# Patient Record
Sex: Male | Born: 1950
Health system: Southern US, Community
[De-identification: ages and names within clinical notes are randomized; demographics above are authoritative.]

## PROBLEM LIST (undated history)

## (undated) DIAGNOSIS — IMO0002 Reserved for concepts with insufficient information to code with codable children: Secondary | ICD-10-CM

## (undated) DIAGNOSIS — K4090 Unilateral inguinal hernia, without obstruction or gangrene, not specified as recurrent: Secondary | ICD-10-CM

## (undated) DIAGNOSIS — K219 Gastro-esophageal reflux disease without esophagitis: Secondary | ICD-10-CM

## (undated) DIAGNOSIS — K602 Anal fissure, unspecified: Secondary | ICD-10-CM

## (undated) DIAGNOSIS — T7840XA Allergy, unspecified, initial encounter: Secondary | ICD-10-CM

## (undated) DIAGNOSIS — K449 Diaphragmatic hernia without obstruction or gangrene: Secondary | ICD-10-CM

## (undated) DIAGNOSIS — M5030 Other cervical disc degeneration, unspecified cervical region: Secondary | ICD-10-CM

## (undated) DIAGNOSIS — C801 Malignant (primary) neoplasm, unspecified: Secondary | ICD-10-CM

## (undated) DIAGNOSIS — M1990 Unspecified osteoarthritis, unspecified site: Secondary | ICD-10-CM

## (undated) DIAGNOSIS — K589 Irritable bowel syndrome without diarrhea: Secondary | ICD-10-CM

## (undated) DIAGNOSIS — I10 Essential (primary) hypertension: Secondary | ICD-10-CM

## (undated) HISTORY — DX: Irritable bowel syndrome, unspecified: K58.9

## (undated) HISTORY — DX: Essential (primary) hypertension: I10

## (undated) HISTORY — DX: Diaphragmatic hernia without obstruction or gangrene: K44.9

## (undated) HISTORY — DX: Unilateral inguinal hernia, without obstruction or gangrene, not specified as recurrent: K40.90

## (undated) HISTORY — DX: Other cervical disc degeneration, unspecified cervical region: M50.30

## (undated) HISTORY — PX: INGUINAL HERNIA REPAIR: SUR1180

## (undated) HISTORY — PX: MOHS SURGERY: SUR867

## (undated) HISTORY — DX: Malignant (primary) neoplasm, unspecified: C80.1

## (undated) HISTORY — PX: TONSILLECTOMY AND ADENOIDECTOMY: SHX28

## (undated) HISTORY — PX: NASAL SEPTUM SURGERY: SHX37

## (undated) HISTORY — DX: Gastro-esophageal reflux disease without esophagitis: K21.9

## (undated) HISTORY — DX: Allergy, unspecified, initial encounter: T78.40XA

## (undated) HISTORY — DX: Anal fissure, unspecified: K60.2

## (undated) HISTORY — DX: Unspecified osteoarthritis, unspecified site: M19.90

## (undated) HISTORY — PX: TONSILLECTOMY AND ADENOIDECTOMY: SUR1326

## (undated) HISTORY — PX: FINGER SURGERY: SHX640

## (undated) HISTORY — DX: Reserved for concepts with insufficient information to code with codable children: IMO0002

## (undated) HISTORY — PX: OTHER SURGICAL HISTORY: SHX169

---

## 1998-08-06 ENCOUNTER — Encounter: Payer: Self-pay | Admitting: Internal Medicine

## 1998-08-06 ENCOUNTER — Ambulatory Visit (HOSPITAL_COMMUNITY): Admission: RE | Admit: 1998-08-06 | Discharge: 1998-08-06 | Payer: Self-pay | Admitting: Internal Medicine

## 1999-08-05 ENCOUNTER — Ambulatory Visit (HOSPITAL_COMMUNITY): Admission: RE | Admit: 1999-08-05 | Discharge: 1999-08-05 | Payer: Self-pay | Admitting: *Deleted

## 1999-08-05 ENCOUNTER — Encounter: Payer: Self-pay | Admitting: *Deleted

## 1999-09-23 ENCOUNTER — Ambulatory Visit (HOSPITAL_COMMUNITY): Admission: RE | Admit: 1999-09-23 | Discharge: 1999-09-23 | Payer: Self-pay | Admitting: *Deleted

## 1999-10-26 ENCOUNTER — Ambulatory Visit (HOSPITAL_BASED_OUTPATIENT_CLINIC_OR_DEPARTMENT_OTHER): Admission: RE | Admit: 1999-10-26 | Discharge: 1999-10-26 | Payer: Self-pay | Admitting: *Deleted

## 2000-12-21 ENCOUNTER — Ambulatory Visit (HOSPITAL_COMMUNITY): Admission: RE | Admit: 2000-12-21 | Discharge: 2000-12-21 | Payer: Self-pay | Admitting: *Deleted

## 2000-12-21 ENCOUNTER — Encounter (INDEPENDENT_AMBULATORY_CARE_PROVIDER_SITE_OTHER): Payer: Self-pay | Admitting: Specialist

## 2000-12-21 ENCOUNTER — Encounter (INDEPENDENT_AMBULATORY_CARE_PROVIDER_SITE_OTHER): Payer: Self-pay | Admitting: *Deleted

## 2002-08-08 ENCOUNTER — Encounter: Admission: RE | Admit: 2002-08-08 | Discharge: 2002-08-08 | Payer: Self-pay | Admitting: *Deleted

## 2002-08-08 ENCOUNTER — Encounter: Payer: Self-pay | Admitting: *Deleted

## 2002-09-05 ENCOUNTER — Encounter (INDEPENDENT_AMBULATORY_CARE_PROVIDER_SITE_OTHER): Payer: Self-pay | Admitting: Specialist

## 2002-09-05 ENCOUNTER — Ambulatory Visit (HOSPITAL_COMMUNITY): Admission: RE | Admit: 2002-09-05 | Discharge: 2002-09-05 | Payer: Self-pay | Admitting: *Deleted

## 2002-09-05 ENCOUNTER — Encounter (INDEPENDENT_AMBULATORY_CARE_PROVIDER_SITE_OTHER): Payer: Self-pay | Admitting: *Deleted

## 2003-11-15 ENCOUNTER — Encounter (INDEPENDENT_AMBULATORY_CARE_PROVIDER_SITE_OTHER): Payer: Self-pay | Admitting: *Deleted

## 2003-11-15 ENCOUNTER — Encounter (INDEPENDENT_AMBULATORY_CARE_PROVIDER_SITE_OTHER): Payer: Self-pay | Admitting: Specialist

## 2003-11-15 ENCOUNTER — Ambulatory Visit (HOSPITAL_COMMUNITY): Admission: RE | Admit: 2003-11-15 | Discharge: 2003-11-15 | Payer: Self-pay | Admitting: *Deleted

## 2006-01-17 ENCOUNTER — Encounter: Payer: Self-pay | Admitting: Physician Assistant

## 2006-01-17 ENCOUNTER — Ambulatory Visit (HOSPITAL_COMMUNITY): Admission: RE | Admit: 2006-01-17 | Discharge: 2006-01-17 | Payer: Self-pay | Admitting: *Deleted

## 2006-01-17 ENCOUNTER — Encounter (INDEPENDENT_AMBULATORY_CARE_PROVIDER_SITE_OTHER): Payer: Self-pay | Admitting: *Deleted

## 2006-02-24 ENCOUNTER — Encounter: Admission: RE | Admit: 2006-02-24 | Discharge: 2006-02-24 | Payer: Self-pay | Admitting: General Surgery

## 2009-03-27 ENCOUNTER — Encounter: Payer: Self-pay | Admitting: Physician Assistant

## 2009-04-24 ENCOUNTER — Encounter (INDEPENDENT_AMBULATORY_CARE_PROVIDER_SITE_OTHER): Payer: Self-pay | Admitting: *Deleted

## 2009-04-24 ENCOUNTER — Ambulatory Visit (HOSPITAL_COMMUNITY): Admission: RE | Admit: 2009-04-24 | Discharge: 2009-04-24 | Payer: Self-pay | Admitting: *Deleted

## 2009-04-24 ENCOUNTER — Encounter: Payer: Self-pay | Admitting: Physician Assistant

## 2009-07-09 ENCOUNTER — Encounter: Payer: Self-pay | Admitting: Physician Assistant

## 2009-07-16 ENCOUNTER — Encounter (INDEPENDENT_AMBULATORY_CARE_PROVIDER_SITE_OTHER): Payer: Self-pay | Admitting: *Deleted

## 2009-07-16 ENCOUNTER — Encounter: Admission: RE | Admit: 2009-07-16 | Discharge: 2009-07-16 | Payer: Self-pay | Admitting: Endocrinology

## 2009-07-16 ENCOUNTER — Telehealth: Payer: Self-pay | Admitting: Gastroenterology

## 2009-07-18 ENCOUNTER — Ambulatory Visit: Payer: Self-pay | Admitting: Gastroenterology

## 2009-07-18 DIAGNOSIS — R197 Diarrhea, unspecified: Secondary | ICD-10-CM | POA: Insufficient documentation

## 2009-07-18 DIAGNOSIS — R143 Flatulence: Secondary | ICD-10-CM

## 2009-07-18 DIAGNOSIS — R141 Gas pain: Secondary | ICD-10-CM | POA: Insufficient documentation

## 2009-07-18 DIAGNOSIS — R142 Eructation: Secondary | ICD-10-CM

## 2009-07-18 DIAGNOSIS — K589 Irritable bowel syndrome without diarrhea: Secondary | ICD-10-CM | POA: Insufficient documentation

## 2009-07-18 DIAGNOSIS — I10 Essential (primary) hypertension: Secondary | ICD-10-CM | POA: Insufficient documentation

## 2009-07-18 DIAGNOSIS — R1084 Generalized abdominal pain: Secondary | ICD-10-CM | POA: Insufficient documentation

## 2009-07-18 DIAGNOSIS — K219 Gastro-esophageal reflux disease without esophagitis: Secondary | ICD-10-CM | POA: Insufficient documentation

## 2009-07-18 DIAGNOSIS — R109 Unspecified abdominal pain: Secondary | ICD-10-CM | POA: Insufficient documentation

## 2009-07-18 LAB — CONVERTED CEMR LAB
CRP, High Sensitivity: 3.5 (ref 0.00–5.00)
Sed Rate: 19 mm/hr (ref 0–22)

## 2009-07-21 ENCOUNTER — Encounter: Payer: Self-pay | Admitting: Physician Assistant

## 2009-08-05 ENCOUNTER — Ambulatory Visit: Payer: Self-pay | Admitting: Gastroenterology

## 2009-08-05 DIAGNOSIS — K4090 Unilateral inguinal hernia, without obstruction or gangrene, not specified as recurrent: Secondary | ICD-10-CM | POA: Insufficient documentation

## 2010-01-25 ENCOUNTER — Emergency Department (HOSPITAL_COMMUNITY): Admission: EM | Admit: 2010-01-25 | Discharge: 2010-01-25 | Payer: Self-pay | Admitting: Emergency Medicine

## 2010-08-29 IMAGING — CT CT PELVIS W/ CM
2 of 5 series · 17 of 46 positions shown, 19 images · IV contrast (30CC OMNI 350 & [ID] OMNI 300)
Comparison: 02/24/2006 pelvic CT.

CT ABDOMEN

CLINICAL DATA: Abdominal and pelvic pain.  Bowel changes.

CT ABDOMEN AND PELVIS WITH CONTRAST
TECHNIQUE: Multidetector CT imaging of the abdomen and pelvis was
performed using the standard protocol following bolus
administration of intravenous contrast.
Contrast: 100 ml intravenous Amnipaque-JHH

[Series 2: abdomen w/ · axial · 0.84mm/px · z∈[-326,+34]mm · 14 of 82 slices shown, 16 images]
[im 5/82  soft-tissue]
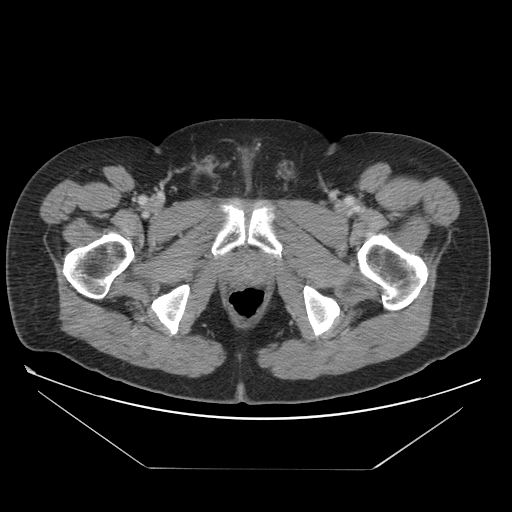
[im 5/82  bone]
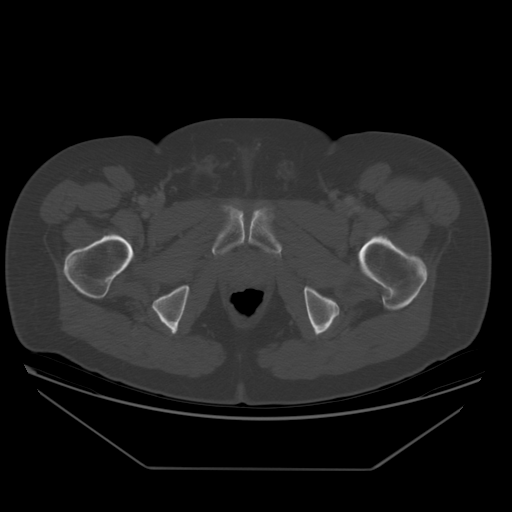
[im 9/82  soft-tissue]
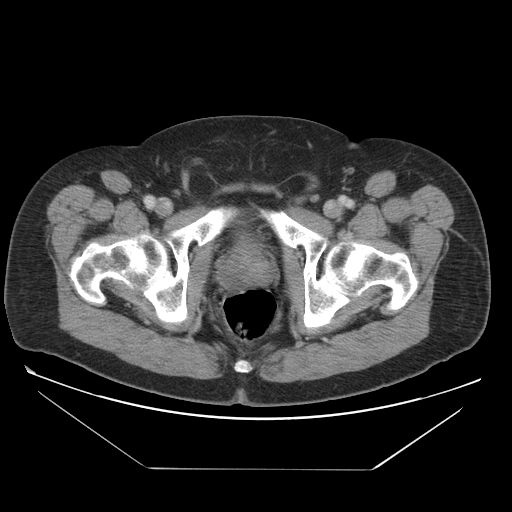
[im 18/82  soft-tissue]
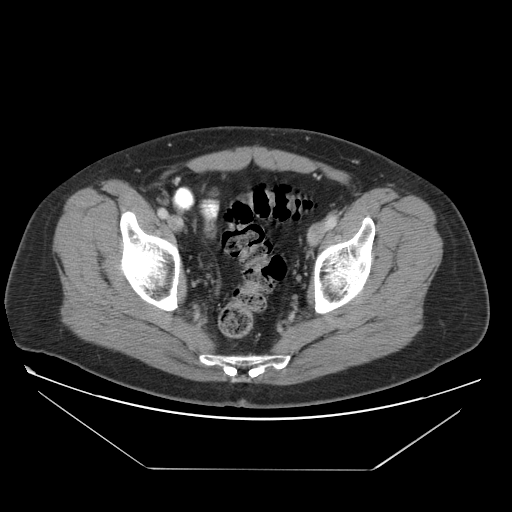
[im 22/82  soft-tissue]
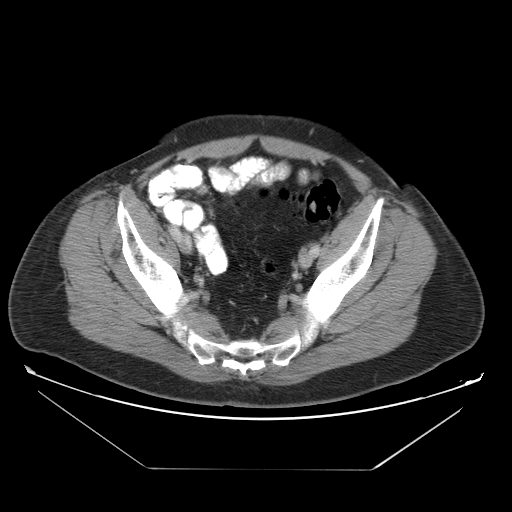
[im 26/82  soft-tissue]
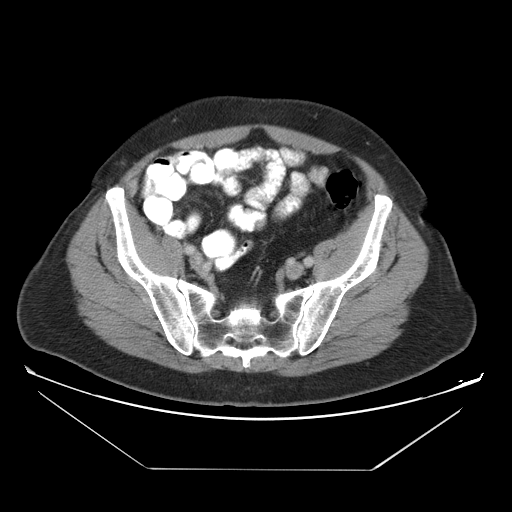
[im 35/82  soft-tissue]
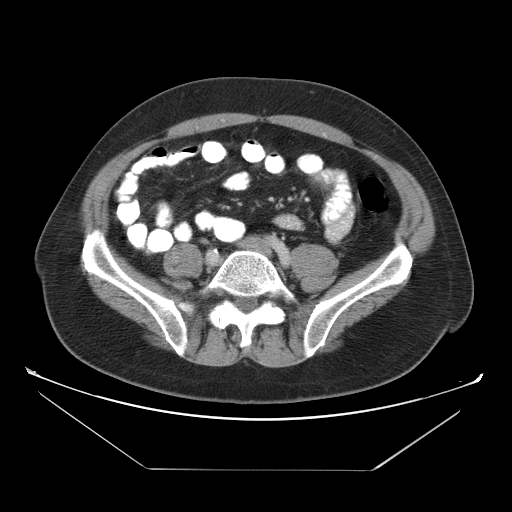
[im 39/82  soft-tissue]
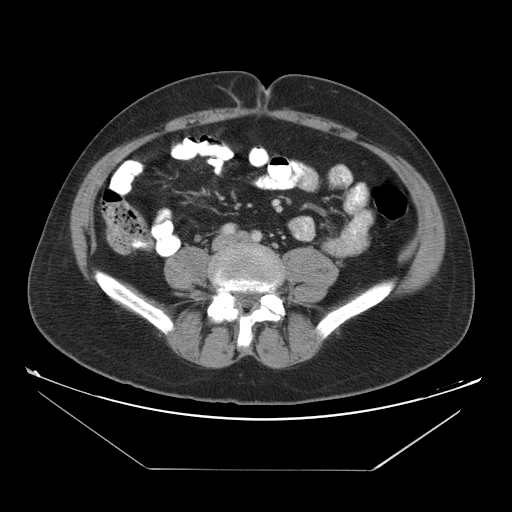
[im 43/82  soft-tissue]
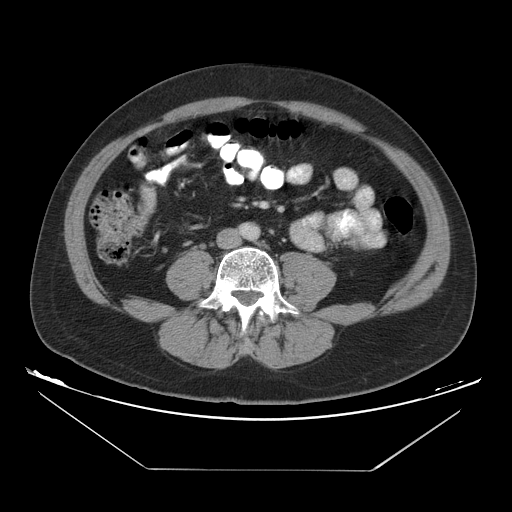
[im 47/82  soft-tissue]
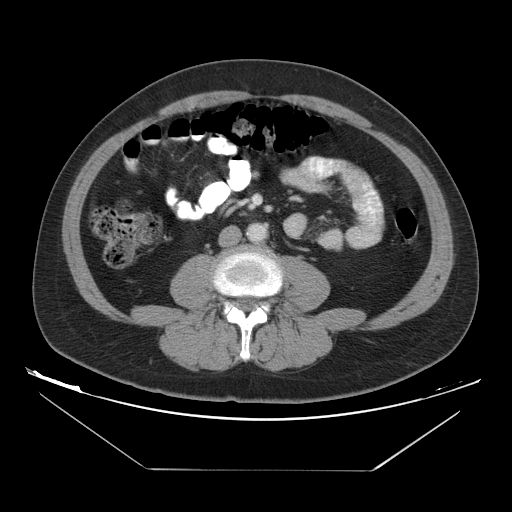
[im 47/82  bone]
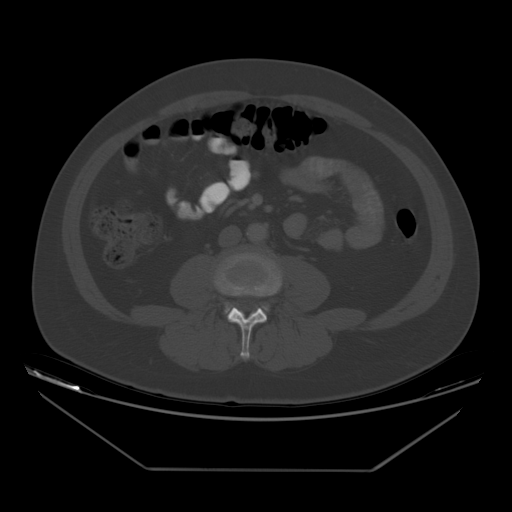
[im 56/82  soft-tissue]
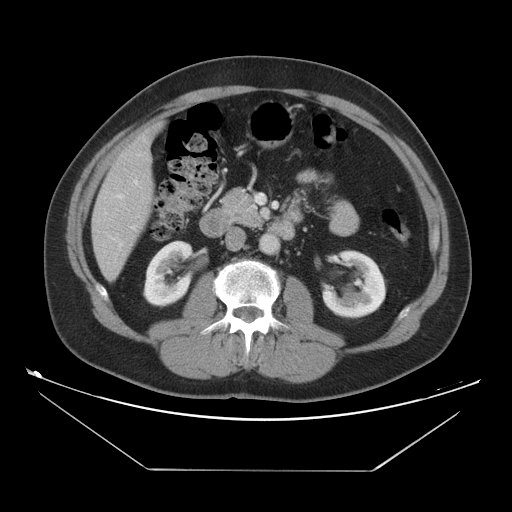
[im 60/82  soft-tissue]
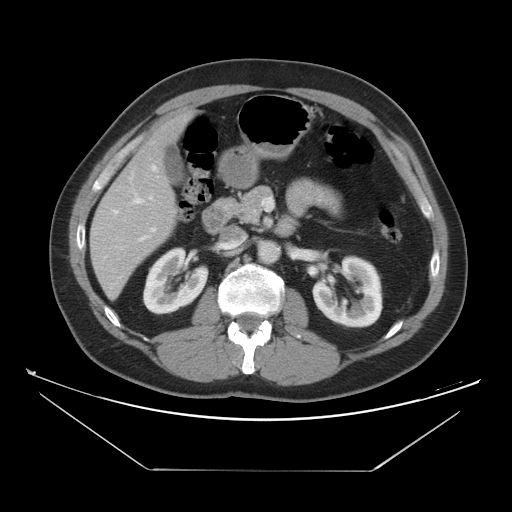
[im 64/82  soft-tissue]
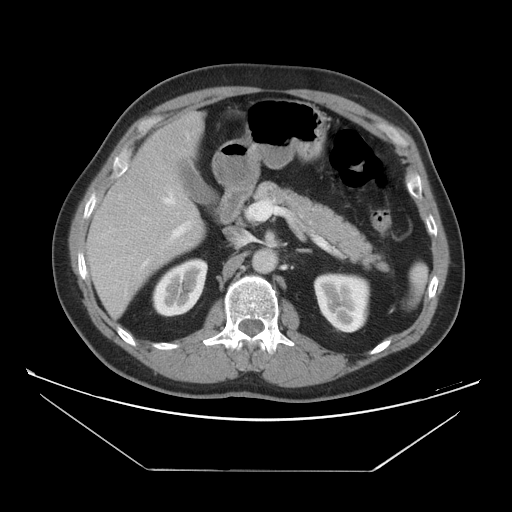
[im 73/82  soft-tissue]
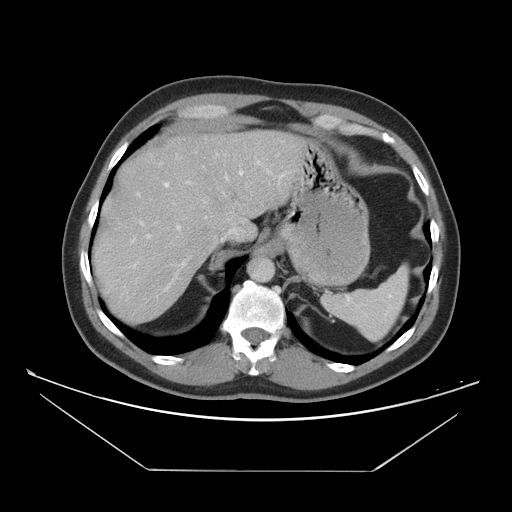
[im 77/82  soft-tissue]
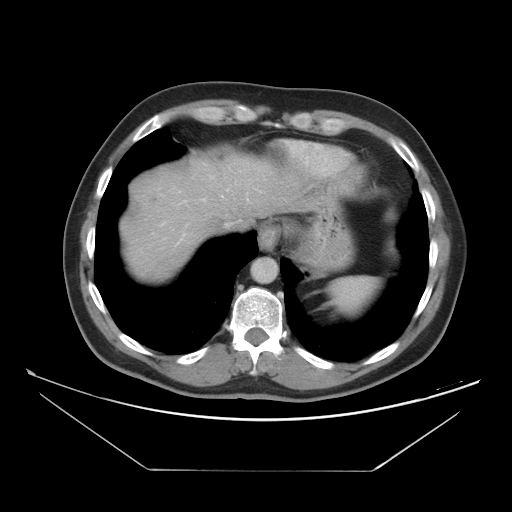

[Series 401: cor · coronal · 0.84mm/px · 3 of 122 slices shown]
[im 41/122  soft-tissue]
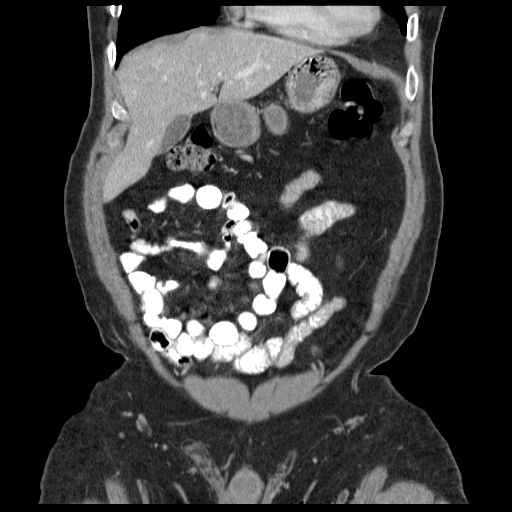
[im 54/122  soft-tissue]
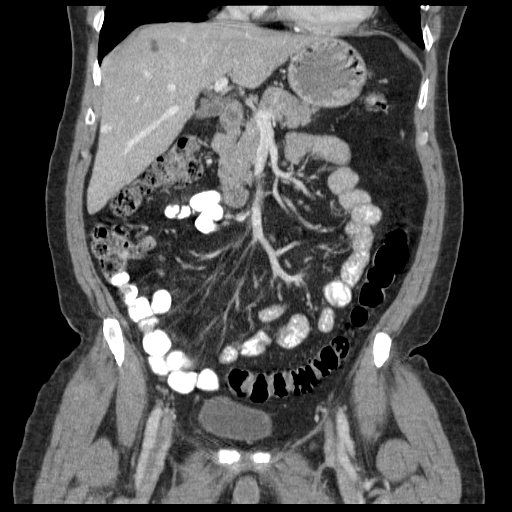
[im 68/122  soft-tissue]
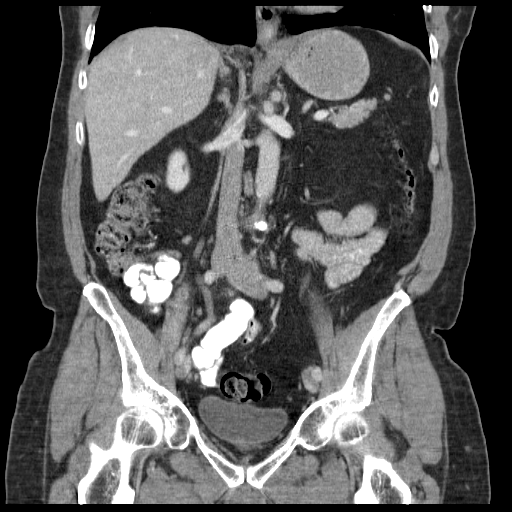

[17 of 46 positions shown; findings below may reference images not displayed]

FINDINGS: Small hepatic cyst is identified.  The liver is
otherwise unremarkable.
The gallbladder, spleen, adrenal glands, kidneys, and pancreas are
unremarkable.
No free fluid, enlarged lymph nodes, biliary dilation or abdominal
aortic aneurysm identified.
The visualized bowel is unremarkable.
No acute or suspicious bony abnormalities are identified.
IMPRESSION: No significant abnormalities identified.

CT PELVIS
FINDINGS: The bowel and bladder are unremarkable.
Small bilateral inguinal hernias containing fat are stable.
No evidence of free fluid, enlarged lymph nodes or urinary calculi
within the pelvis.
Mild prostate enlargement is again identified.
No acute or suspicious bony abnormalities are identified.
IMPRESSION: No evidence of acute abnormality.

Stable small bilateral inguinal hernias containing fat.

## 2010-11-01 ENCOUNTER — Encounter (HOSPITAL_BASED_OUTPATIENT_CLINIC_OR_DEPARTMENT_OTHER): Payer: Self-pay | Admitting: General Surgery

## 2010-12-31 ENCOUNTER — Encounter: Payer: Self-pay | Admitting: Gastroenterology

## 2010-12-31 ENCOUNTER — Ambulatory Visit (INDEPENDENT_AMBULATORY_CARE_PROVIDER_SITE_OTHER): Payer: 59 | Admitting: Gastroenterology

## 2010-12-31 DIAGNOSIS — K219 Gastro-esophageal reflux disease without esophagitis: Secondary | ICD-10-CM

## 2010-12-31 DIAGNOSIS — K602 Anal fissure, unspecified: Secondary | ICD-10-CM

## 2010-12-31 DIAGNOSIS — K589 Irritable bowel syndrome without diarrhea: Secondary | ICD-10-CM

## 2010-12-31 MED ORDER — LANSOPRAZOLE 30 MG PO CPDR: 30.0000 mg | DELAYED_RELEASE_CAPSULE | Freq: Every day | ORAL | Status: DC

## 2010-12-31 MED ORDER — HYDROCORTISONE ACETATE 25 MG RE SUPP: 25.0000 mg | Freq: Two times a day (BID) | RECTAL | Status: AC

## 2010-12-31 NOTE — Progress Notes (Signed)
History of Present Illness:  John Bird has returned with several GI complaints. He has a history of GERD.  Since   Run ning out of Prevacid he's had multiple episodes of severe lower chest discomfort accompanied by pyrosis. He denies dysphagia. Since his herniorrhaphy he's had rectal discomfort with limited bleeding. He has discomfort both during a bowel movement, and for sometime afterwards. Although he has a daily bowel movement in the morning he has lower abdominal discomfort with the urge to with his bowels again, throughout the day. Most of the time he is unsuccessful.    Review of Systems:  The patient reports some sexual dysfunction despite taking viagra and cialis.  Pertinent positive and negative review of systems were noted in the above HPI section. All other review of systems were otherwise negative.    Current Medications, Allergies, Past Medical History, Past Surgical History, Family History and Social History were reviewed in [employer] electronic medical record  Vital signs were reviewed in today's medical record. Physical Exam: General: Well developed , well nourished, no acute distress Head: Normocephalic and atraumatic Eyes:  sclerae anicteric, EOMI Ears: Normal auditory acuity Mouth: No deformity or lesions Lungs: Clear throughout to auscultation Heart: Regular rate and rhythm; no murmurs, rubs or bruits Abdomen: Soft, non tender and non distended. No masses, hepatosplenomegaly or hernias noted. Normal Bowel sounds Rectal: there is a anal fissure at the posterior midline Musculoskeletal: Symmetrical with no gross deformities  Pulses:  Normal pulses noted Extremities: No clubbing, cyanosis, edema or deformities noted Neurological: Alert oriented x 4, grossly nonfocal Psychological:  Alert and cooperative. Normal mood and affect

## 2010-12-31 NOTE — Assessment & Plan Note (Signed)
The patient's rectal discomfort and limited bleeding is due to anal fissure.  Recommendations #1 warm soaks. #2 Anusol HC suppositories. #3 I instructed the patient to contact me in 2-3 weeks. If symptoms persist despite these medicines at which point I would try diltiazem cream

## 2010-12-31 NOTE — Assessment & Plan Note (Signed)
Plan to add fiber supplementation

## 2010-12-31 NOTE — Patient Instructions (Signed)
We are sending in a rx for Prevacid today Diet for GERD or PUD Nutrition therapy can help ease the discomfort of gastroesophageal reflux disease (GERD) and peptic ulcer disease (PUD).  HOME CARE INSTRUCTIONS  Eat your meals slowly, in a relaxed setting.   Eat 5 to 6 small meals per day.   If a food causes distress, stop eating it for a period of time.  FOODS TO AVOID:  Coffee, regular or decaffeinated.  Cola beverages, regular or low calorie.   Tea, regular or decaffeinated.   Pepper.   Cocoa.   High fat foods including meats.   Butter, margarine, hydrogenated oil (trans fats).  Peppermint or spearmint (if you have GERD).   Fruits and vegetables as tolerated.   Alcoholic beverages.   Nicotine (smoking or chewing). This is one of the most potent stimulants to acid production in the gastrointestinal tract.   Any food that seems to aggravate your condition.   If you have questions regarding your diet, call your caregiver's office or a registered dietitian. OTHER TIPS IF YOU HAVE GERD:  Lying flat may make symptoms worse. Keep the head of your bed raised 6 to 9 inches by using a foam wedge or blocks under the legs of the bed.   Do not lay down until 3 hours after eating a meal.   Daily physical activity may help reduce symptoms.  MAKE SURE YOU:   Understand these instructions.   Will watch your condition.   Will get help right away if you are not doing well or get worse.  Document Released: 09/27/2005 Document Re-Released: 02/13/2009 [facility] Patient Information 2011 [place], [place].    Anal Fissure An anal fissure often begins with sharp pain. This is usually following a bowel movement. It often causes bright red blood stained stools. It is the most common cause of rectal bleeding. One common cause of this is passage of a large, hard stool. It can also be caused by having frequent diarrheal stools. Anal fissures that occur for a longtime (chronic) may require  surgery. CAUSES  Passing large, hard stools.   Frequent diarrheal stools.   Constipation.  SYMPTOMS  Bright red, blood stained stools.   Rectal bleeding.  HOME CARE INSTRUCTIONS  If constipation is the cause of the rectal fissure, it may be necessary to add a bulk-forming laxative. A diet high in fruits, whole grains, and vegetables will also help.   Taking hot sitz baths for 1 half hour 4 times per day may help.   Increase your fluid intake.   Only take over-the-counter or prescription medicines for pain, discomfort, or fever as directed by your caregiver. Do not take aspirin as this may increase the tendency for bleeding.   Do not use ointments containing anesthetic medications or hydrocortisone. They could slow healing.   Avoid constipating foods such as bananas and cheese.   In children, brown Karo syrup may be used by adding 1 teaspoon of syrup to 8 ounces of formula. An alternative is to give 3 teaspoons of mineral oil every day.  SEEK MEDICAL CARE IF: Rectal bleeding continues, changes in intensity, or becomes more severe. MAKE SURE YOU:   Understand these instructions.   Will watch your condition.   Will get help right away if you are not doing well or get worse.  Document Released: 09/27/2005 Document Re-Released: 12/22/2009 [facility] Patient Information 2011 [place], [place].

## 2010-12-31 NOTE — Assessment & Plan Note (Signed)
Patient is symptomatic again, off medications.  Indications #1 resume Prevacid

## 2011-01-08 ENCOUNTER — Encounter: Payer: Self-pay | Admitting: Gastroenterology

## 2011-02-23 NOTE — Op Note (Signed)
John Bird, John Bird                ACCOUNT NO.:  [id number]   MEDICAL RECORD NO.:  [id number]          PATIENT TYPE:  AMB   LOCATION:  ENDO                         FACILITY:  [facility]   PHYSICIAN:  Georgiana Spinner, M.D.    DATE OF BIRTH:  December 12, 1950   DATE OF PROCEDURE:  DATE OF DISCHARGE:                               OPERATIVE REPORT   PROCEDURE:  Upper endoscopy.   INDICATIONS:  Esophagitis and GERD.   ANESTHESIA:  Fentanyl 62.5 mcg, Versed 6 mg.   PROCEDURE:  With the patient mildly sedated in the left lateral  decubitus position, the Pentax videoscopic endoscope was inserted in the  mouth and passed under direct vision through the esophagus which  appeared normal.  The distal esophagus showed peak pointed changes of  squamocolumnar junction, either a normal variant or possible Barrett's  and I photographed and biopsied this area.  I then entered into the  stomach fundus, body, antrum, duodenal bulb, second portion of the  duodenum appeared normal.  From this point, the endoscope was slowly  withdrawn taking circumferential views of the duodenal mucosa until the  endoscope had been pulled back into the stomach and placed in  retroflexion to view the stomach from below.  The endoscope was  straightened and withdrawn taking circumferential views of the remaining  gastric and esophageal mucosa.  The patient's vital signs and pulse  oximeter remained stable.  The patient tolerated the procedure well with  no apparent complication.   FINDINGS:  Changes as described above.  Await biopsy report.  The  patient will call me for results and follow-up with me as an outpatient.           ______________________________  Georgiana Spinner, M.D.     GMO/MEDQ  D:  04/24/2009  T:  04/24/2009  Job:  [id number]

## 2011-02-26 NOTE — Op Note (Signed)
   NAME:  John Bird, John Bird                          ACCOUNT NO.:  [id number]   MEDICAL RECORD NO.:  [id number]                   PATIENT TYPE:  AMB   LOCATION:  ENDO                                 FACILITY:  [facility]   PHYSICIAN:  Georgiana Spinner, M.D.                 DATE OF BIRTH:  May 25, 1951   DATE OF PROCEDURE:  DATE OF DISCHARGE:                                 OPERATIVE REPORT   PROCEDURE:  Upper endoscopy with biopsy.   INDICATIONS:  Barrett's esophagus.   ANESTHESIA:  Demerol 70, Versed 5 mg.   PROCEDURE:  With the patient mildly sedated in the left lateral decubitus  position, the Olympus videoscopic endoscope was inserted in the mouth and  passed under direct vision through the esophagus, and there appeared to be  areas of Barrett's esophagus that we photographed and then took multiple  biopsies around the GE junction.  Once accomplished, the endoscope was  advanced into the stomach.  The fundus, body, antrum, duodenal bulb, and  second portion of the duodenum appeared normal.  From this point, the  endoscope was slowly withdrawn, taking circumferential views in the entire  duodenal mucosa.  The endoscope was then pulled back into the stomach and  placed in retroflexion view of the stomach from below.  The endoscope was  then straightened and withdrawn, taking circumferential views in the  remaining gastric and esophageal mucosa.  The patient's vital signs and  pulse oximeter remained stable.  The patient tolerated the procedure well  without apparent complications.   FINDINGS:  Question of Barrett's esophagus, biopsied.   PLAN:  Await biopsy report.  The patient will call me for results and follow  up with me as an outpatient.                                               Georgiana Spinner, M.D.    GMO/MEDQ  D:  09/05/2002  T:  09/05/2002  Job:  [id number]

## 2011-02-26 NOTE — Op Note (Signed)
[place]  Patient:    John Bird, John Bird                       MRN: [id number] Proc. Date: 12/21/00 Adm. Date:  [id number] Attending:  Sabino Gasser                           Operative Report  PROCEDURE PERFORMED:  Colonoscopy.  ENDOSCOPIST:  Sabino Gasser, M.D.  INDICATIONS FOR PROCEDURE:  Hemoccult positivity.  ANESTHESIA:  See endoscopy note.  DESCRIPTION OF PROCEDURE:  With the patient mildly sedated in the left lateral decubitus position, the Olympus videoscopic colonoscope was inserted in the rectum and passed under direct vision into the cecum.  The cecum was identified by the ileocecal valve and appendiceal orifice, both of which were photographed.  We entered into the terminal ileum  which also appeared normal and was photographed.  From this point, the colonoscope was slowly withdrawn, taking circumferential views of the entire colonic mucosa, stopping in the rectum, which appeared normal on direct view and showed possibly hemorrhoids on retroflex view.  The endoscope was straightened and withdrawn.  Patients vital signs and pulse oximeter remained stable.  The patient tolerated the procedure well and without apparent complications.  FINDINGS:  Internal hemorrhoids.  Otherwise unremarkable colonoscopic examination including the terminal ileum.  PLAN:  See endoscopy note for further details. DD:  12/21/00 TD:  12/21/00 Job: [id number]

## 2011-02-26 NOTE — Procedures (Signed)
. [facility]  Patient:    John Bird, John Bird                       MRN: [id number] Proc. Date: 12/21/00 Adm. Date:  [id number] Attending:  Sabino Gasser                           Procedure Report  PROCEDURE PERFORMED:  Upper endoscopy.  ENDOSCOPIST:  Sabino Gasser, M.D.  INDICATIONS FOR PROCEDURE:  Hemoccult positive stool with gastroesophageal reflux disease symptoms.  ANESTHESIA: Total Demerol 70 mg, Versed 9 mg was given.  DESCRIPTION OF PROCEDURE:  With the patient mildly sedated in the left lateral decubitus position, the Olympus videoscopic endoscope was inserted in the mouth and passed under direct vision through the esophagus.  The distal esophagus was approached and showed changes of esophagitis and maybe some early scarring.  Photographs and biopsies were taken.  Once accomplished, the endoscope was advanced into the stomach.  The fundus, body, antrum, duodenal bulb and second portion of the duodenum were all well-viewed and appeared normal.  From this point, the endoscope was slowly withdrawn taking circumferential views of the entire duodenal mucosa until the endoscope had been pulled back into the stomach and placed on retroflexion to view the stomach from below and a hiatal hernia was seen and photographed.  The endoscope was then straightened and withdrawn taking circumferential views of the entire gastric mucosa and subsequently esophageal mucosa, all of which appeared normal.  Patients vital signs and pulse oximeter remained stable. The patient tolerated the procedure well without apparent complications.  FINDINGS:  Changes of esophagitis of distal esophagus.  Await biopsy report. Will have patient call me for results and follow up with me as an outpatient. Proceed to colonoscopy. DD:  12/21/00 TD:  12/21/00 Job: [id number]

## 2011-02-26 NOTE — Op Note (Signed)
NAMESTEPHENS, SHREVE                ACCOUNT NO.:  [id number]   MEDICAL RECORD NO.:  [id number]          PATIENT TYPE:  AMB   LOCATION:  ENDO                         FACILITY:  [facility]   PHYSICIAN:  Georgiana Spinner, M.D.    DATE OF BIRTH:  July 31, 1951   DATE OF PROCEDURE:  01/17/2006  DATE OF DISCHARGE:                                 OPERATIVE REPORT   PROCEDURE:  Colonoscopy.   INDICATIONS FOR PROCEDURE:  Rectal bleeding.   ANESTHESIA:  Fentanyl 75 mcg, Versed 5 mg.   DESCRIPTION OF PROCEDURE:  With the patient mildly sedated in the left  lateral decubitus position, the Olympus videoscopic colonoscope was inserted  in the rectum and after a normal rectal exam had passed under direct vision  to the cecum identified by the ileocecal valve and base of cecum which we  visualized. The former was photographed twice as it turns out. Subsequently,  the colonoscope was slowly withdrawn taking circumferential views of the  colonic mucosa stopping in the rectum which appeared normal on direct and  showed hemorrhoidal tissue on retroflexed view. The endoscope was  straightened and withdrawn. The patient's vital signs and pulse oximeter  remained stable. The patient tolerated the procedure well without apparent  complications.   FINDINGS:  Small internal hemorrhoids otherwise an unremarkable examination.   PLAN:  Followup with me on an as needed basis.           ______________________________  Georgiana Spinner, M.D.     GMO/MEDQ  D:  01/17/2006  T:  01/17/2006  Job:  [id number]

## 2011-02-26 NOTE — Op Note (Signed)
NAME:  John Bird, John Bird                          ACCOUNT NO.:  [id number]   MEDICAL RECORD NO.:  [id number]                   PATIENT TYPE:  AMB   LOCATION:  ENDO                                 FACILITY:  [facility]   PHYSICIAN:  Georgiana Spinner, M.D.                 DATE OF BIRTH:  27-Dec-1950   DATE OF PROCEDURE:  11/15/2003  DATE OF DISCHARGE:                                 OPERATIVE REPORT   PROCEDURE:  Upper endoscopy with biopsy.   INDICATIONS:  Barrett's..   ANESTHESIA:  Demerol 60, Versed 6 mg.   DESCRIPTION OF PROCEDURE:  With patient mildly sedated in the left lateral  decubitus position, the Olympus videoscopic endoscope was inserted in the  mouth, passed under direct vision through the esophagus, which appeared  normal until we reached the distal esophagus, and there were changes of  Barrett's esophagus, photographed and biopsied.  We entered into the stomach  through a hiatal hernia.  Fundus, body, antrum, duodenal bulb, and second  portion of duodenum all appeared normal on first view.  From this point, the  endoscope was slowly withdrawn, taking circumferential views of the duodenal  mucosa until the endoscope then pulled back into the stomach, placed in  retroflexion to view the stomach from below, and a loose wrap of the GE  junction was noted and photographed.  The endoscope was straightened and  withdrawn, taking circumferential views of the remaining gastric and  esophageal mucosa, stopping to biopsy a thickened fold that had been  previously seen.  It was soft, and it had normal-appearing mucosa.  This was  biopsied, and the endoscope was withdrawn taking circumferential views of  the remaining gastric and esophageal mucosa.  The patient's vital signs and  pulse oximeter remained stable.  The patient tolerated the procedure well  without apparent complications.   FINDINGS:  Hiatal hernia with Barrett's esophagus with thickened fold,  otherwise unremarkable exam.   PLAN:  1. Await biopsy report, clinical response.  2. Continue the patient on proton pump inhibitor therapy for the time being     and have patient follow up with me as an outpatient.                                               Georgiana Spinner, M.D.    GMO/MEDQ  D:  11/15/2003  T:  11/15/2003  Job:  [id number]

## 2011-04-12 ENCOUNTER — Telehealth: Payer: Self-pay | Admitting: *Deleted

## 2011-04-12 ENCOUNTER — Encounter: Payer: Self-pay | Admitting: Gastroenterology

## 2011-04-12 ENCOUNTER — Ambulatory Visit (AMBULATORY_SURGERY_CENTER): Payer: 59 | Admitting: *Deleted

## 2011-04-12 VITALS — Ht 67.0 in | Wt 194.9 lb

## 2011-04-12 DIAGNOSIS — Z1211 Encounter for screening for malignant neoplasm of colon: Secondary | ICD-10-CM

## 2011-04-12 MED ORDER — PEG-KCL-NACL-NASULF-NA ASC-C 100 G PO SOLR: ORAL | Status: DC

## 2011-04-12 NOTE — Telephone Encounter (Signed)
If bleeding has stopped we can cancel colonoscopy.  Needs f/u colo 5 years I don't see documentation for Barrett's.  If he biopsies from before 2007 we need to review them. OK to continue fibercon indefinately

## 2011-04-12 NOTE — Telephone Encounter (Signed)
Patient's egd pathology results from e-chart placed on your desk with chart. Let me know if patient needs anything. Thanks.

## 2011-04-12 NOTE — Telephone Encounter (Signed)
ALSO TOLD PATIENT HE COULD TAKE FIBERCON PILL DAILY AS NEEDED PER DR.KAPLANS NOTES. PT UNDERSTANDS. CANCELLED COLONOSCOPY AT THIS TIME. PATIENT UNDERSTANDS.

## 2011-04-12 NOTE — Telephone Encounter (Signed)
Spoke with patient and let him know Dr.Kaplan's response to questions. Patient understands and states he's bleeding has stopped and he feels that he does not need colonoscopy at this time. In e chart results all biopsies for egd negative for barretts. Patient states he had no other egd's except by Dr.Orr. So he does not have barretts. Patient denies any upper symptoms at this time.

## 2011-04-12 NOTE — Telephone Encounter (Signed)
PATIENT IN FOR PREVISIT TODAY FOR COLONOSCOPY ON 04-26-11. HE DID HAVE OFFICE VISIT WITH YOU. HE WAS ASKING IF IT INDEED TIME FOR RECALL COLONOSCOPY. LAST COLONOSCOPY WAS WITH DR.ORR ON 01-17-2006,ALSO HE HAS HX OF BARRETTS LAST EGD 04-24-2009. PATIENT STATES RECTAL SYMPTOMS BETTER AND RECTAL BLEEDING HAS STOPPED SINCE OFFICE VISIT WITH YOU.  I PLACED ALL REPORTS FROM DR.ORR WITH PATIENT'S CHART ON YOUR DESK FOR REVIEW. PLEASE ADVISE. ALSO PATIENT ASKING IF SHOULD CONTINUE TAKING FIBERCON (CALCIUM POLYCARBOPHIL 625MG ) DAILY,STATES THIS IS HELPING BUT BOTTLE SAID TO STOP AFTER 7 DAYS SO HE DID. PLEASE ADVISE.  THANKS. Sherren Kerns

## 2011-04-13 NOTE — Telephone Encounter (Signed)
Review of several biopsies of the GE junction dating back to 2002 demonstrate inflammatory changes only. There is no evidence for Barrett's.  No further GI workup is required.

## 2011-04-13 NOTE — Telephone Encounter (Signed)
Biopsy reports dating back to 2002 of the GE junction demonstrated inflammatory changes only. There is no evidence for Barrett's. No further workup is required.

## 2011-04-26 ENCOUNTER — Other Ambulatory Visit: Payer: 59 | Admitting: Gastroenterology

## 2011-05-24 ENCOUNTER — Other Ambulatory Visit: Payer: Self-pay | Admitting: Gastroenterology

## 2011-08-03 ENCOUNTER — Other Ambulatory Visit: Payer: Self-pay | Admitting: Gastroenterology

## 2011-12-20 ENCOUNTER — Other Ambulatory Visit: Payer: Self-pay | Admitting: Gastroenterology

## 2012-01-24 ENCOUNTER — Encounter: Payer: Self-pay | Admitting: Gastroenterology

## 2012-01-24 ENCOUNTER — Ambulatory Visit (INDEPENDENT_AMBULATORY_CARE_PROVIDER_SITE_OTHER): Payer: 59 | Admitting: Gastroenterology

## 2012-01-24 VITALS — BP 116/76 | HR 64 | Ht 67.0 in | Wt 184.2 lb

## 2012-01-24 DIAGNOSIS — K219 Gastro-esophageal reflux disease without esophagitis: Secondary | ICD-10-CM

## 2012-01-24 DIAGNOSIS — K602 Anal fissure, unspecified: Secondary | ICD-10-CM

## 2012-01-24 DIAGNOSIS — K589 Irritable bowel syndrome without diarrhea: Secondary | ICD-10-CM

## 2012-01-24 MED ORDER — OMEPRAZOLE-SODIUM BICARBONATE 40-1100 MG PO CAPS: 1.0000 | ORAL_CAPSULE | ORAL | Status: DC | PRN

## 2012-01-24 NOTE — Patient Instructions (Addendum)
Colonoscopy A colonoscopy is an exam to evaluate your entire colon. In this exam, your colon is cleansed. A long fiberoptic tube is inserted through your rectum and into your colon. The fiberoptic scope (endoscope) is a long bundle of enclosed and very flexible fibers. These fibers transmit light to the area examined and send images from that area to your caregiver. Discomfort is usually minimal. You may be given a drug to help you sleep (sedative) during or prior to the procedure. This exam helps to detect lumps (tumors), polyps, inflammation, and areas of bleeding. Your caregiver may also take a small piece of tissue (biopsy) that will be examined under a microscope. LET YOUR CAREGIVER KNOW ABOUT:   Allergies to food or medicine.   Medicines taken, including vitamins, herbs, eyedrops, over-the-counter medicines, and creams.   Use of steroids (by mouth or creams).   Previous problems with anesthetics or numbing medicines.   History of bleeding problems or blood clots.   Previous surgery.   Other health problems, including diabetes and kidney problems.   Possibility of pregnancy, if this applies.  BEFORE THE PROCEDURE   A clear liquid diet may be required for 2 days before the exam.   Ask your caregiver about changing or stopping your regular medications.   Liquid injections (enemas) or laxatives may be required.   A large amount of electrolyte solution may be given to you to drink over a short period of time. This solution is used to clean out your colon.   You should be present 60 minutes prior to your procedure or as directed by your caregiver.  AFTER THE PROCEDURE   If you received a sedative or pain relieving medication, you will need to arrange for someone to drive you home.   Occasionally, there is a little blood passed with the first bowel movement. Do not be concerned.  FINDING OUT THE RESULTS OF YOUR TEST Not all test results are available during your visit. If your test  results are not back during the visit, make an appointment with your caregiver to find out the results. Do not assume everything is normal if you have not heard from your caregiver or the medical facility. It is important for you to follow up on all of your test results. HOME CARE INSTRUCTIONS   It is not unusual to pass moderate amounts of gas and experience mild abdominal cramping following the procedure. This is due to air being used to inflate your colon during the exam. Walking or a warm pack on your belly (abdomen) may help.   You may resume all normal meals and activities after sedatives and medicines have worn off.   Only take over-the-counter or prescription medicines for pain, discomfort, or fever as directed by your caregiver. Do not use aspirin or blood thinners if a biopsy was taken. Consult your caregiver for medicine usage if biopsies were taken.  SEEK IMMEDIATE MEDICAL CARE IF:   You have a fever.   You pass large blood clots or fill a toilet with blood following the procedure. This may also occur 10 to 14 days following the procedure. This is more likely if a biopsy was taken.   You develop abdominal pain that keeps getting worse and cannot be relieved with medicine.  Document Released: 09/24/2000 Document Revised: 09/16/2011 Document Reviewed: 05/09/2008 [facility] Patient Information 2012 [place], [place].  Your Dilatizem gel was sent to [facility]

## 2012-01-24 NOTE — Assessment & Plan Note (Signed)
He is having intermittent episodes of nocturnal GERD.  Recommendations #1 upper endoscopy #2 trial of Zegerid

## 2012-01-24 NOTE — Assessment & Plan Note (Signed)
Plan to add diltiazem ointment

## 2012-01-24 NOTE — Progress Notes (Signed)
History of Present Illness:  [name] continues to complain of multiple loose stools daily. He is taking fiber supplements intermittently. Rectal pain is slightly better after using Anusol but it remains. He's also having minimal rectal bleeding after a bowel movement. He complains of intermittent but severe pyrosis. He takes Prevacid only as necessary. Last night he took Maalox 8 times without much relief.    Review of Systems: Pertinent positive and negative review of systems were noted in the above HPI section. All other review of systems were otherwise negative.    Current Medications, Allergies, Past Medical History, Past Surgical History, Family History and Social History were reviewed in [employer] electronic medical record  Vital signs were reviewed in today's medical record. Physical Exam: General: Well developed , well nourished, no acute distress

## 2012-01-24 NOTE — Assessment & Plan Note (Signed)
Plan colonoscopy because of persistence of loose stools

## 2012-01-25 ENCOUNTER — Ambulatory Visit (AMBULATORY_SURGERY_CENTER): Payer: 59 | Admitting: Gastroenterology

## 2012-01-25 ENCOUNTER — Encounter: Payer: Self-pay | Admitting: Gastroenterology

## 2012-01-25 VITALS — BP 134/76 | HR 52 | Temp 96.7°F | Resp 15 | Ht 67.0 in | Wt 184.0 lb

## 2012-01-25 DIAGNOSIS — K602 Anal fissure, unspecified: Secondary | ICD-10-CM

## 2012-01-25 DIAGNOSIS — K219 Gastro-esophageal reflux disease without esophagitis: Secondary | ICD-10-CM

## 2012-01-25 DIAGNOSIS — D126 Benign neoplasm of colon, unspecified: Secondary | ICD-10-CM

## 2012-01-25 DIAGNOSIS — R197 Diarrhea, unspecified: Secondary | ICD-10-CM

## 2012-01-25 DIAGNOSIS — K589 Irritable bowel syndrome without diarrhea: Secondary | ICD-10-CM

## 2012-01-25 MED ORDER — SODIUM CHLORIDE 0.9 % IV SOLN: 500.0000 mL | INTRAVENOUS | Status: DC

## 2012-01-25 NOTE — Patient Instructions (Addendum)

## 2012-01-25 NOTE — Progress Notes (Signed)
Propofol per [facility]. See scanned intra procedure report. ewm 

## 2012-01-25 NOTE — Op Note (Signed)
[facility] [address] [place], [place]  [address]  COLONOSCOPY PROCEDURE REPORT  PATIENT:  John Bird, John Bird  MR#:  [id number] BIRTHDATE:  [birth year], 61 yrs. old  GENDER:  male ENDOSCOPIST:  Barbette Hair. Arlyce Dice, MD REF. BY:  Adela Lank, M.D. PROCEDURE DATE:  01/25/2012 PROCEDURE:  Colonoscopy with biopsy ASA CLASS:  Class II INDICATIONS:  unexplained diarrhea MEDICATIONS:   MAC sedation, administered by CRNA propofol 150mg IV  DESCRIPTION OF PROCEDURE:   After the risks benefits and alternatives of the procedure were thoroughly explained, informed consent was obtained.  Digital rectal exam was performed and revealed no abnormalities.   The LB 180AL [id number] endoscope was introduced through the anus and advanced to the cecum, which was identified by both the appendix and ileocecal valve, without limitations.  The quality of the prep was excellent, using MoviPrep.  The instrument was then slowly withdrawn as the colon was fully examined. <<PROCEDUREIMAGES>>  FINDINGS:  A normal appearing cecum, ileocecal valve, and appendiceal orifice were identified. The ascending, hepatic flexure, transverse, splenic flexure, descending, sigmoid colon, and rectum appeared unremarkable. Random biopsies were taken throughout the colon to r/o microscopic colitis (see image1, image2, and image3).   Retroflexed views in the rectum revealed no abnormalities.    The time to cecum =  1) 2.0  minutes. The scope was then withdrawn in  1) 6.50  minutes from the cecum and the procedure completed. COMPLICATIONS:  None ENDOSCOPIC IMPRESSION: 1) Normal colon RECOMMENDATIONS:Await biopsy results Reduce fiber supplementation to once daily Office visit 1 month  REPEAT EXAM:  10 years  ______________________________ Barbette Hair. Arlyce Dice, MD  CC:  n. eSIGNED:   Barbette Hair. Lucciana Head at 01/25/2012 02:03 PM  Lenoria Farrier, [phone]

## 2012-01-25 NOTE — Progress Notes (Signed)
Patient did not experience any of the following events: a burn prior to discharge; a fall within the facility; wrong site/side/patient/procedure/implant event; or a hospital transfer or hospital admission upon discharge from the facility. (G8907) Patient did not have preoperative order for IV antibiotic SSI prophylaxis. (G8918)  

## 2012-01-25 NOTE — Op Note (Signed)
[facility] [address] [place], [place]  [address]  ENDOSCOPY PROCEDURE REPORT  PATIENT:  John Bird, John Bird  MR#:  [id number] BIRTHDATE:  1951-08-18, 61 yrs. old  GENDER:  male  ENDOSCOPIST:  Barbette Hair. Arlyce Dice, MD Referred by:  Adela Lank, M.D.  PROCEDURE DATE:  01/25/2012 PROCEDURE:  EGD, diagnostic 43235 ASA CLASS:  Class II INDICATIONS:  GERD  MEDICATIONS:   There was residual sedation effect present from prior procedure., MAC sedation, administered by CRNA propofol 50mg IV, glycopyrrolate (Robinal) 0.2 mg IV, 0.6cc simethancone 0.6 cc PO TOPICAL ANESTHETIC:  DESCRIPTION OF PROCEDURE:   After the risks and benefits of the procedure were explained, informed consent was obtained.  The LB GIF-H180 [id number] endoscope was introduced through the mouth and advanced to the third portion of the duodenum.  The instrument was slowly withdrawn as the mucosa was fully examined. <<PROCEDUREIMAGES>>  Multiple erosions were found at the gastroesophageal junction (see image2 and image1). Grade B erosive esophagitis  Otherwise the examination was normal (see image3 and image4).    Retroflexed views revealed no abnormalities.    The scope was then withdrawn from the patient and the procedure completed.  COMPLICATIONS:  None  ENDOSCOPIC IMPRESSION: 1) Erosions, multiple at the gastroesophageal junction 2) Otherwise normal examination RECOMMENDATIONS: 1) continue PPI - zegerid 2) Call office next 2-3 days to schedule an office appointment for 1 month  ______________________________ Barbette Hair. Arlyce Dice, MD  CC:  n. eSIGNED:   Barbette Hair. Phoenix Dresser at 01/25/2012 02:06 PM  Lenoria Farrier, [phone]

## 2012-01-26 ENCOUNTER — Telehealth: Payer: Self-pay | Admitting: *Deleted

## 2012-01-26 NOTE — Telephone Encounter (Signed)
  Follow up Call-  Call back number 01/25/2012  Post procedure Call Back phone  # [phone]  Permission to leave phone message Yes     Patient questions:  Do you have a fever, pain , or abdominal swelling? no Pain Score  0 *  Have you tolerated food without any problems? yes  Have you been able to return to your normal activities? yes  Do you have any questions about your discharge instructions: Diet   no Medications  no Follow up visit  no  Do you have questions or concerns about your Care? no  Actions: * If pain score is 4 or above: No action needed, pain <4.

## 2012-02-01 ENCOUNTER — Encounter: Payer: Self-pay | Admitting: Gastroenterology

## 2012-03-09 ENCOUNTER — Encounter: Payer: Self-pay | Admitting: Gastroenterology

## 2012-03-09 ENCOUNTER — Ambulatory Visit (INDEPENDENT_AMBULATORY_CARE_PROVIDER_SITE_OTHER): Payer: 59 | Admitting: Gastroenterology

## 2012-03-09 VITALS — BP 128/84 | HR 62 | Ht 67.0 in | Wt 186.0 lb

## 2012-03-09 DIAGNOSIS — K219 Gastro-esophageal reflux disease without esophagitis: Secondary | ICD-10-CM

## 2012-03-09 DIAGNOSIS — K589 Irritable bowel syndrome without diarrhea: Secondary | ICD-10-CM

## 2012-03-09 DIAGNOSIS — K602 Anal fissure, unspecified: Secondary | ICD-10-CM

## 2012-03-09 DIAGNOSIS — K4090 Unilateral inguinal hernia, without obstruction or gangrene, not specified as recurrent: Secondary | ICD-10-CM

## 2012-03-09 NOTE — Assessment & Plan Note (Addendum)
Symptoms are well-controlled with zegerid which he has recently discontinued. He'll resume this when necessary

## 2012-03-09 NOTE — Patient Instructions (Signed)
Follow up as needed

## 2012-03-09 NOTE — Assessment & Plan Note (Signed)
Improved with medical therapy 

## 2012-03-09 NOTE — Progress Notes (Signed)
History of Present Illness:  John Bird has returned for followup of reflux and rectal pain. Upper endoscopy demonstrated mild erosive esophagitis. On Zegerid symptoms have significantly improved. Colonoscopy was normal. He has minimal, intermittent bleeding when he moves his bowels that is clearly improved. He is without rectal pain. His primary concern is mild left inguinal pain when he moves his bowels. He's had a hernia repair previously. He also has from one to 4 bowel movements a day that are initially solid and then  may become loose. Abdominal pain or frank diarrhea have subsided since he discontinued using fish oil.    Review of Systems: Pertinent positive and negative review of systems were noted in the above HPI section. All other review of systems were otherwise negative.    Current Medications, Allergies, Past Medical History, Past Surgical History, Family History and Social History were reviewed in [employer] electronic medical record  Vital signs were reviewed in today's medical record. Physical Exam: General: Well developed , well nourished, no acute distress On abdominal exam there is no obvious inguinal hernia

## 2012-03-09 NOTE — Assessment & Plan Note (Signed)
There is no overt recurrent hernia. Pain is probably due to musculoskeletal pain.  Recommendations #1 Advil when necessary #2 if symptoms worsen I would refer him back to surgery

## 2012-03-09 NOTE — Assessment & Plan Note (Signed)
I have instructed him to take fiber daily.

## 2012-09-12 ENCOUNTER — Other Ambulatory Visit: Payer: Self-pay | Admitting: Dermatology

## 2012-10-17 ENCOUNTER — Other Ambulatory Visit: Payer: Self-pay | Admitting: Dermatology

## 2013-03-23 ENCOUNTER — Other Ambulatory Visit: Payer: Self-pay | Admitting: Endocrinology

## 2013-03-23 DIAGNOSIS — R109 Unspecified abdominal pain: Secondary | ICD-10-CM

## 2013-03-30 ENCOUNTER — Ambulatory Visit
Admission: RE | Admit: 2013-03-30 | Discharge: 2013-03-30 | Disposition: A | Discharge status: Home / Self Care | Payer: 59 | Source: Ambulatory Visit | Attending: Endocrinology | Admitting: Endocrinology

## 2013-03-30 DIAGNOSIS — R109 Unspecified abdominal pain: Secondary | ICD-10-CM

## 2013-03-30 MED ORDER — IOHEXOL 300 MG/ML  SOLN
100.0000 mL | Freq: Once | INTRAMUSCULAR | Status: AC | PRN
  Administered 2013-03-30: 100 mL via INTRAVENOUS

## 2013-04-10 ENCOUNTER — Encounter (INDEPENDENT_AMBULATORY_CARE_PROVIDER_SITE_OTHER): Payer: Self-pay

## 2013-04-11 ENCOUNTER — Ambulatory Visit (INDEPENDENT_AMBULATORY_CARE_PROVIDER_SITE_OTHER): Payer: 59 | Admitting: General Surgery

## 2013-04-11 ENCOUNTER — Encounter (INDEPENDENT_AMBULATORY_CARE_PROVIDER_SITE_OTHER): Payer: Self-pay | Admitting: General Surgery

## 2013-04-11 VITALS — BP 150/80 | HR 62 | Resp 16 | Ht 67.0 in | Wt 189.4 lb

## 2013-04-11 DIAGNOSIS — R1032 Left lower quadrant pain: Secondary | ICD-10-CM | POA: Insufficient documentation

## 2013-04-11 NOTE — Progress Notes (Signed)
Patient ID: John Bird, male   DOB: December 03, 1950, 62 y.o.   MRN: [id number]  No chief complaint on file.   HPI John Bird is a 62 y.o. male.  He is referred by Dr. Adela Lank, for evaluation of left groin pain.  This patient underwent laparoscopic repair of bilateral inguinal hernias with mesh by Dr. Michaell Cowing at some point in the past. He did well after that surgery. He said he's had some left groin pain for one year. No bulge. Not related specifically to physical activities. No aggravating or alleviating factors. Occasional testicular pain. He said a CT scan which was normal showing no no recurrence of the hernia, adenopathy or inflammatory process. He says he golfs a lot and wonders if he has strained a muscle. Denies voiding problems or prostate problems. His colonoscopies regularly.  Comorbidities include hypertension, GERD, irritable bowel syndrome, anal fissure.  HPI  Past Medical History  Diagnosis Date  . Hernia, inguinal   . GERD (gastroesophageal reflux disease)   . Hemorrhoids   . IBS (irritable bowel syndrome)   . Hiatal hernia   . Hypertension   . Allergy   . Cancer     basal cell CA- forehead  . Ulcer     Past Surgical History  Procedure Laterality Date  . Right knee surgery    . Finger surgery      Left Pinky finger  . Nasal septum surgery    . Tonsillectomy and adenoidectomy    . Inguinal hernia repair      bilateral    Family History  Problem Relation Age of Onset  . Heart disease Father   . Heart disease Brother   . Colon cancer Neg Hx   . Esophageal cancer Neg Hx   . Rectal cancer Neg Hx   . Stomach cancer Neg Hx     Social History History  Substance Use Topics  . Smoking status: Former Smoker    Quit date: 10/11/1980  . Smokeless tobacco: Never Used  . Alcohol Use: 0.0 oz/week     Comment: 2-3 drinks per MONTH    No Known Allergies  Current Outpatient Prescriptions  Medication Sig Dispense Refill  . aspirin 81 MG tablet Take 81 mg  by mouth daily.        . valsartan-hydrochlorothiazide (DIOVAN-HCT) 160-12.5 MG per tablet Take by mouth. Take 1/2 tablet by mouth once daily       . AMBULATORY NON FORMULARY MEDICATION Place 2 % rectally 2 (two) times daily. Medication Name: Dilatizam Gel      . omeprazole-sodium bicarbonate (ZEGERID) 40-1100 MG per capsule Take 1 capsule by mouth as needed.  30 capsule  1   No current facility-administered medications for this visit.    Review of Systems Review of Systems  Constitutional: Negative for fever, chills and unexpected weight change.  HENT: Negative for hearing loss, congestion, sore throat, trouble swallowing and voice change.   Eyes: Negative for visual disturbance.  Respiratory: Negative for cough and wheezing.   Cardiovascular: Negative for chest pain, palpitations and leg swelling.  Gastrointestinal: Negative for nausea, vomiting, abdominal pain, diarrhea, constipation, blood in stool, abdominal distention, anal bleeding and rectal pain.  Genitourinary: Negative for hematuria and difficulty urinating.  Musculoskeletal: Positive for myalgias. Negative for arthralgias.  Skin: Negative for rash and wound.  Neurological: Negative for seizures, syncope, weakness and headaches.  Hematological: Negative for adenopathy. Does not bruise/bleed easily.  Psychiatric/Behavioral: Negative for confusion.    Blood pressure 150/80,  pulse 62, resp. rate 16, height 5\' 7"  (1.702 m), weight 189 lb 6.4 oz (85.911 kg).  Physical Exam Physical Exam  Constitutional: He is oriented to person, place, and time. He appears well-developed and well-nourished. No distress.  HENT:  Head: Normocephalic.  Nose: Nose normal.  Mouth/Throat: No oropharyngeal exudate.  Eyes: Conjunctivae and EOM are normal. Pupils are equal, round, and reactive to light. Right eye exhibits no discharge. Left eye exhibits no discharge. No scleral icterus.  Neck: Normal range of motion. Neck supple. No JVD present. No  tracheal deviation present. No thyromegaly present.  Cardiovascular: Normal rate, regular rhythm, normal heart sounds and intact distal pulses.   No murmur heard. Pulmonary/Chest: Effort normal and breath sounds normal. No stridor. No respiratory distress. He has no wheezes. He has no rales. He exhibits no tenderness.  Abdominal: Soft. Bowel sounds are normal. He exhibits no distension and no mass. There is no tenderness. There is no rebound and no guarding.  Abdomen is soft and nontender. 3 trocar sites well healed. No evidence of recurrent hernia in supine or standing. No inguinal adenopathy. Not tender. Penis scrotum and testes normal.  Musculoskeletal: Normal range of motion. He exhibits no edema and no tenderness.  Lymphadenopathy:    He has no cervical adenopathy.  Neurological: He is alert and oriented to person, place, and time. He has normal reflexes. Coordination normal.  Skin: Skin is warm and dry. No rash noted. He is not diaphoretic. No erythema. No pallor.  Psychiatric: He has a normal mood and affect. His behavior is normal. Judgment and thought content normal.    Data Reviewed Office notes from Dr. Juleen China  Assessment    Left groin pain. No evidence of recurrent hernia, adenopathy, or infection. No evidence of testicular pathology  Possibilities include musculoskeletal strain, less likely urologic problem  Hypertension  Irritable bowel syndrome  GERD     Plan    Long discussion with the patient regarding my differential diagnosis. He seemed reassured that there is no radiographic or clinical findings of significant disease.  Restrict activities for 3 weeks. No sports or lifting. Ibuprofen 3 times a day.  If symptoms persist, suggest referral to orthopedics surgery to rule out muscle skeletal cause.  If no musculoskeletal cause found, last resort would be urologic consultation.  See me prn        Angelia Mould. Derrell Lolling, M.D., [facility] General and Minimally invasive Surgery Breast and Colorectal Surgery Office:   [phone] Pager:   [phone]  04/11/2013, 6:09 PM

## 2013-04-11 NOTE — Patient Instructions (Signed)
Your examination today does not reveal any abnormality of the left groin. The hernia repair is intact. There is no infection. There are no enlarged lymph nodes. Your testicle feels normal.  Your CT scan is also normal. We don't see any abnormality of the kidney. The hernia repair is intact. There is no inflammatory process or enlarged lymph nodes.  It is possible that this is simply a muscle strain. I recommend no sports or heavy lifting for 3 weeks, and take ibuprofen 3 times a day.  If the pain persists, ask Dr. Juleen China  to refer you to an orthopedic surgeon. Sometimes there are spine or hip problems that can cause pain like this.  Lastly, if there is no other explanation, I would see a urologist.  Return to see Dr. Michaell Cowing or Dr. Derrell Lolling if surgical problems arise.

## 2013-11-07 ENCOUNTER — Telehealth: Payer: Self-pay | Admitting: *Deleted

## 2013-11-07 MED ORDER — LANSOPRAZOLE 30 MG PO CPDR: 30.0000 mg | DELAYED_RELEASE_CAPSULE | Freq: Every day | ORAL | Status: DC

## 2013-11-07 NOTE — Telephone Encounter (Signed)
MED SENT LANSOPRAZOLE

## 2014-03-29 ENCOUNTER — Ambulatory Visit (INDEPENDENT_AMBULATORY_CARE_PROVIDER_SITE_OTHER): Payer: 59 | Admitting: General Surgery

## 2014-03-29 ENCOUNTER — Encounter (INDEPENDENT_AMBULATORY_CARE_PROVIDER_SITE_OTHER): Payer: Self-pay | Admitting: General Surgery

## 2014-03-29 VITALS — BP 126/78 | HR 51 | Temp 97.8°F | Ht 67.0 in | Wt 191.0 lb

## 2014-03-29 DIAGNOSIS — D1720 Benign lipomatous neoplasm of skin and subcutaneous tissue of unspecified limb: Secondary | ICD-10-CM | POA: Insufficient documentation

## 2014-03-29 DIAGNOSIS — D1779 Benign lipomatous neoplasm of other sites: Secondary | ICD-10-CM

## 2014-03-29 NOTE — Progress Notes (Signed)
Patient ID: [name], male   DOB: 1951-07-19, 63 y.o.   MRN: [id number]  Chief Complaint  Patient presents with  . eval mass left innner thigh    HPI John Bird is a 63 y.o. male.  He is referred by Dr. Wilson Singer for evaluation of soft tissue mass of the left upper inner thigh.  The patient has noted a painless lump in his upper inner thigh, a very high, for about 6 months. There has been no skin change or tenderness. It has not changed in size.  Past history consistent for bilateral inguinal hernia repair, Dr. Johney Maine, no problems with that. Hypertension. Hiatal hernia. Irritable bowel syndrome.  HPI  Past Medical History  Diagnosis Date  . Hernia, inguinal   . GERD (gastroesophageal reflux disease)   . Hemorrhoids   . IBS (irritable bowel syndrome)   . Hiatal hernia   . Hypertension   . Allergy   . Cancer     basal cell CA- forehead  . Ulcer     Past Surgical History  Procedure Laterality Date  . Right knee surgery    . Finger surgery      Left Pinky finger  . Nasal septum surgery    . Tonsillectomy and adenoidectomy    . Inguinal hernia repair      bilateral    Family History  Problem Relation Age of Onset  . Heart disease Father   . Heart disease Brother   . Colon cancer Neg Hx   . Esophageal cancer Neg Hx   . Rectal cancer Neg Hx   . Stomach cancer Neg Hx     Social History History  Substance Use Topics  . Smoking status: Former Smoker    Quit date: 10/11/1980  . Smokeless tobacco: Never Used  . Alcohol Use: 0.0 oz/week     Comment: 2-3 drinks per MONTH    No Known Allergies  Current Outpatient Prescriptions  Medication Sig Dispense Refill  . AMBULATORY NON FORMULARY MEDICATION Place 2 % rectally 2 (two) times daily. Medication Name: Dilatizam Gel      . aspirin 81 MG tablet Take 81 mg by mouth daily.        . valsartan-hydrochlorothiazide (DIOVAN-HCT) 160-12.5 MG per tablet Take by mouth. Take 1/2 tablet by mouth once daily       .  omeprazole-sodium bicarbonate (ZEGERID) 40-1100 MG per capsule Take 1 capsule by mouth as needed.  30 capsule  1   No current facility-administered medications for this visit.    Review of Systems Review of Systems  Constitutional: Negative for fever, chills and unexpected weight change.  HENT: Negative for congestion, hearing loss, sore throat, trouble swallowing and voice change.   Eyes: Negative for visual disturbance.  Respiratory: Negative for cough and wheezing.   Cardiovascular: Negative for chest pain, palpitations and leg swelling.  Gastrointestinal: Negative for nausea, vomiting, abdominal pain, diarrhea, constipation, blood in stool, abdominal distention, anal bleeding and rectal pain.  Genitourinary: Negative for hematuria and difficulty urinating.  Musculoskeletal: Negative for arthralgias.  Skin: Negative for rash and wound.  Neurological: Negative for seizures, syncope, weakness and headaches.  Hematological: Negative for adenopathy. Does not bruise/bleed easily.  Psychiatric/Behavioral: Negative for confusion.    Blood pressure 126/78, pulse 51, temperature 97.8 F (36.6 C), height 5\' 7"  (1.702 m), weight 191 lb (86.637 kg).  Physical Exam Physical Exam  Constitutional: He is oriented to person, place, and time. He appears well-developed and well-nourished. No distress.  HENT:  Head: Normocephalic.  Nose: Nose normal.  Mouth/Throat: No oropharyngeal exudate.  Eyes: Conjunctivae and EOM are normal. Pupils are equal, round, and reactive to light. Right eye exhibits no discharge. Left eye exhibits no discharge. No scleral icterus.  Neck: Normal range of motion. Neck supple. No JVD present. No tracheal deviation present. No thyromegaly present.  Cardiovascular: Normal rate, regular rhythm, normal heart sounds and intact distal pulses.   No murmur heard. Pulmonary/Chest: Effort normal and breath sounds normal. No stridor. No respiratory distress. He has no wheezes. He  has no rales. He exhibits no tenderness.  Abdominal: Soft. Bowel sounds are normal. He exhibits no distension and no mass. There is no tenderness. There is no rebound and no guarding.  Well-healed laparoscopic scars. Abdominal exam unremarkable.  Genitourinary:  Bilateral inguinal hernia repairs intact. No inguinal tenderness. No inguinal adenopathy.  Musculoskeletal: Normal range of motion. He exhibits no edema and no tenderness.  There is a 3.0 cm soft tissue mass of the left thigh, directly medial, just below the inguinal area. Skin is healthy. Nontender. Consistent with benign lipoma.  Lymphadenopathy:    He has no cervical adenopathy.  Neurological: He is alert and oriented to person, place, and time. He has normal reflexes. Coordination normal.  Skin: Skin is warm and dry. No rash noted. He is not diaphoretic. No erythema. No pallor.  Psychiatric: He has a normal mood and affect. His behavior is normal. Judgment and thought content normal.    Data Reviewed Dr. Eugenio Hoes office notes.  Assessment    3 cm, asymptomatic lipoma left upper inner thigh.     Plan    I discussed my clinical diagnosis with him. We discussed the options of observation, imaging, or surgical excision. Since he is asymptomatic, and this has remained stable for 6 months, he decided to do nothing at this time.  I advised him to return to see me if this area enlarges, becomes painful, or develops any skin changes.        John Bird. Dalbert Batman, M.D., [facility] General and Minimally invasive Surgery Breast and Colorectal Surgery Office:   [phone] Pager:   [phone]  03/29/2014, 10:42 AM

## 2014-03-29 NOTE — Patient Instructions (Signed)
The palpable mass of your left upper inner thigh is small, approximately 3 cm in size, it is consistent with a benign lipoma. The fact that it hasn't changed in 6 months is also consistent with a benign lipoma.  We have discussed options of observation, imaging studies, and surgery. You have decided to just keep him on this.  Please contact Dr. Dalbert Batman if this area enlarges or becomes painful or develops any change in the skin. If it does not change that nothing needs to be done.    Lipoma A lipoma is a noncancerous (benign) tumor composed of fat cells. They are usually found under the skin (subcutaneous). A lipoma may occur in any tissue of the body that contains fat. Common areas for lipomas to appear include the back, shoulders, buttocks, and thighs. Lipomas are a very common soft tissue growth. They are soft and grow slowly. Most problems caused by a lipoma depend on where it is growing. DIAGNOSIS  A lipoma can be diagnosed with a physical exam. These tumors rarely become cancerous, but radiographic studies can help determine this for certain. Studies used may include:  Computerized X-ray scans (CT or CAT scan).  Computerized magnetic scans (MRI). TREATMENT  Small lipomas that are not causing problems may be watched. If a lipoma continues to enlarge or causes problems, removal is often the best treatment. Lipomas can also be removed to improve appearance. Surgery is done to remove the fatty cells and the surrounding capsule. Most often, this is done with medicine that numbs the area (local anesthetic). The removed tissue is examined under a microscope to make sure it is not cancerous. Keep all follow-up appointments with your caregiver. SEEK MEDICAL CARE IF:   The lipoma becomes larger or hard.  The lipoma becomes painful, red, or increasingly swollen. These could be signs of infection or a more serious condition. Document Released: 09/17/2002 Document Revised: 12/20/2011 Document Reviewed:  02/27/2010 [facility] Patient Information 2015 [place], [place]. This information is not intended to replace advice given to you by your health care Merle Whitehorn. Make sure you discuss any questions you have with your health care Ilse Billman.

## 2014-04-09 ENCOUNTER — Encounter (INDEPENDENT_AMBULATORY_CARE_PROVIDER_SITE_OTHER): Payer: Self-pay

## 2016-03-24 ENCOUNTER — Encounter: Payer: Self-pay | Admitting: Gastroenterology

## 2016-03-25 ENCOUNTER — Other Ambulatory Visit: Payer: Self-pay | Admitting: Endocrinology

## 2016-03-25 DIAGNOSIS — K921 Melena: Secondary | ICD-10-CM

## 2016-03-25 DIAGNOSIS — R1032 Left lower quadrant pain: Secondary | ICD-10-CM

## 2016-04-05 ENCOUNTER — Ambulatory Visit
Admission: RE | Admit: 2016-04-05 | Discharge: 2016-04-05 | Disposition: A | Discharge status: Home / Self Care | Payer: 59 | Source: Ambulatory Visit | Attending: Endocrinology | Admitting: Endocrinology

## 2016-04-05 DIAGNOSIS — R1032 Left lower quadrant pain: Secondary | ICD-10-CM

## 2016-04-05 DIAGNOSIS — K921 Melena: Secondary | ICD-10-CM

## 2016-04-05 MED ORDER — IOPAMIDOL (ISOVUE-300) INJECTION 61%
100.0000 mL | Freq: Once | INTRAVENOUS | Status: AC | PRN
  Administered 2016-04-05: 100 mL via INTRAVENOUS

## 2016-04-09 ENCOUNTER — Ambulatory Visit (INDEPENDENT_AMBULATORY_CARE_PROVIDER_SITE_OTHER): Payer: 59 | Admitting: Family Medicine

## 2016-04-09 VITALS — BP 162/90 | HR 53 | Temp 98.1°F | Resp 16 | Ht 67.0 in | Wt 193.0 lb

## 2016-04-09 DIAGNOSIS — R1010 Upper abdominal pain, unspecified: Secondary | ICD-10-CM | POA: Diagnosis not present

## 2016-04-09 DIAGNOSIS — R109 Unspecified abdominal pain: Secondary | ICD-10-CM

## 2016-04-09 DIAGNOSIS — M545 Low back pain, unspecified: Secondary | ICD-10-CM

## 2016-04-09 DIAGNOSIS — K219 Gastro-esophageal reflux disease without esophagitis: Secondary | ICD-10-CM

## 2016-04-09 LAB — POCT URINALYSIS DIP (MANUAL ENTRY)
BILIRUBIN UA: NEGATIVE
Bilirubin, UA: NEGATIVE
Glucose, UA: NEGATIVE
Leukocytes, UA: NEGATIVE
Nitrite, UA: NEGATIVE
PROTEIN UA: NEGATIVE
SPEC GRAV UA: 1.015
UROBILINOGEN UA: 0.2
pH, UA: 5.5

## 2016-04-09 LAB — POC MICROSCOPIC URINALYSIS (UMFC): Mucus: ABSENT

## 2016-04-09 MED ORDER — DICLOFENAC SODIUM 75 MG PO TBEC: 75.0000 mg | DELAYED_RELEASE_TABLET | Freq: Two times a day (BID) | ORAL | Status: DC

## 2016-04-09 MED ORDER — OMEPRAZOLE 40 MG PO CPDR: 40.0000 mg | DELAYED_RELEASE_CAPSULE | Freq: Every day | ORAL | Status: DC

## 2016-04-09 MED ORDER — CYCLOBENZAPRINE HCL 5 MG PO TABS
5.0000 mg | ORAL_TABLET | Freq: Three times a day (TID) | ORAL | Status: DC | PRN
Start: 2016-04-09 — End: 2016-06-01

## 2016-04-09 NOTE — Progress Notes (Addendum)
Subjective:    Patient ID: John Bird, male    DOB: 1951-10-02, 65 y.o.   MRN: [id number] By signing my name below, I, John Bird, attest that this documentation has been prepared under the direction and in the presence of Delman Cheadle, MD. Electronically Signed: Judithe Bird, ER Scribe. 04/09/2016. 7:28 PM.  Chief Complaint  Patient presents with  . Back Pain    muscles spasms    HPI HPI Comments: John Bird is a 65 y.o. male who presents to [facility] complaining of lower back pain that radiates laterally. At onset his pain was fosussed in the LLQ and a CT was ordered by his PCP which was normal. He is having normal BM. He has a past hx of hernia surgery many years ago. He denies fever, chills, constipation, blood in stools, changes in BM or urination or weakness or numbness or in legs. He has been eating and drinking normally. He takes prevacid as needed for GERD sx. His blood pressure has been normal until the last two weeks since the onset of his back pain. He does not have a blood pressure cuff at home. He normally takes half a pill of Diovan every other day.   His PCP is Dr. Wilson Singer.   Past Medical History  Diagnosis Date  . Hernia, inguinal   . GERD (gastroesophageal reflux disease)   . Hemorrhoids   . IBS (irritable bowel syndrome)   . Hiatal hernia   . Hypertension   . Allergy   . Cancer (HCC)     basal cell CA- forehead  . Ulcer    No Known Allergies  Current Outpatient Prescriptions on File Prior to Visit  Medication Sig Dispense Refill  . valsartan-hydrochlorothiazide (DIOVAN-HCT) 160-12.5 MG per tablet Take by mouth. Take 1/2 tablet by mouth once daily     . omeprazole-sodium bicarbonate (ZEGERID) 40-1100 MG per capsule Take 1 capsule by mouth as needed. 30 capsule 1   No current facility-administered medications on file prior to visit.    Review of Systems  Constitutional: Positive for activity change. Negative for fever, chills and unexpected weight  change.  Cardiovascular: Negative for leg swelling.  Gastrointestinal: Negative for diarrhea and constipation.  Genitourinary: Negative for dysuria, urgency, frequency and difficulty urinating.  Musculoskeletal: Positive for myalgias, back pain and arthralgias. Negative for joint swelling, gait problem, neck pain and neck stiffness.  Skin: Negative for color change and rash.  Neurological: Negative for weakness and numbness.  Hematological: Negative for adenopathy.  Psychiatric/Behavioral: Positive for sleep disturbance.      Objective:  BP 162/90 mmHg  Pulse 53  Temp(Src) 98.1 F (36.7 C) (Oral)  Resp 16  Ht 5\' 7"  (1.702 m)  Wt 193 lb (87.544 kg)  BMI 30.22 kg/m2  SpO2 100%  Physical Exam  Constitutional: He is oriented to person, place, and time. He appears well-developed and well-nourished. No distress.  HENT:  Head: Normocephalic and atraumatic.  Eyes: Pupils are equal, round, and reactive to light.  Neck: Neck supple.  Cardiovascular: Normal rate.   Pulmonary/Chest: Effort normal. No respiratory distress.  Abdominal: Soft. Bowel sounds are normal. He exhibits no distension. There is no tenderness.  Musculoskeletal: Normal range of motion.  Normal hip ROM. No tenderness over trochanteric bursa. 5/5 lower extremity strength. No TTP over lumbar spinous processes. Prominent lower lumbar upper sacral muscle spasms. 2+ patellar and achilles DTRs.   Neurological: He is alert and oriented to person, place, and time. Coordination normal.  Skin: Skin is warm and dry. He is not diaphoretic.  Psychiatric: He has a normal mood and affect. His behavior is normal.  Nursing note and vitals reviewed.     Results for orders placed or performed in visit on 04/09/16  POCT urinalysis dipstick  Result Value Ref Range   Color, UA yellow yellow   Clarity, UA clear clear   Glucose, UA negative negative   Bilirubin, UA negative negative   Ketones, POC UA negative negative   Spec Grav, UA  1.015    Blood, UA trace-intact (A) negative   pH, UA 5.5    Protein Ur, POC negative negative   Urobilinogen, UA 0.2    Nitrite, UA Negative Negative   Leukocytes, UA Negative Negative  POCT Microscopic Urinalysis ([facility])  Result Value Ref Range   WBC,UR,HPF,POC None None WBC/hpf   RBC,UR,HPF,POC None None RBC/hpf   Bacteria None None, Too numerous to count   Mucus Absent Absent   Epithelial Cells, UR Per Microscopy Few (A) None, Too numerous to count cells/hpf     Assessment & Plan:   1. Bilateral low back pain without sciatica   2. Gastroesophageal reflux disease, esophagitis presence not specified   3. Flank pain, acute   Suspect MSK pain - pt appears VERY uncomfortable just sitting on the exam bed and clearly has a hard time with any trunk movements.  He has had a in-depth w/u by his PCP with labs and abd/pelvic CT that were all normal and available for my review today so can effectively r/o any internal cause at this point.  Orders Placed This Encounter  Procedures  . POCT urinalysis dipstick  . POCT Microscopic Urinalysis ([facility])    Meds ordered this encounter  Medications  . omeprazole (PRILOSEC) 40 MG capsule    Sig: Take 1 capsule (40 mg total) by mouth daily. 30 minutes before dinner    Dispense:  30 capsule    Refill:  0  . cyclobenzaprine (FLEXERIL) 5 MG tablet    Sig: Take 1 tablet (5 mg total) by mouth 3 (three) times daily as needed for muscle spasms. Take 2 tabs po qhs    Dispense:  60 tablet    Refill:  0  . diclofenac (VOLTAREN) 75 MG EC tablet    Sig: Take 1 tablet (75 mg total) by mouth 2 (two) times daily.    Dispense:  60 tablet    Refill:  1    I personally performed the services described in this documentation, which was scribed in my presence. The recorded information has been reviewed and considered, and addended by me as needed.   Delman Cheadle, M.D.  [facility] [address] [place], [place] [address] [phone] phone [phone] fax  04/12/2016 8:16 AM

## 2016-04-09 NOTE — Patient Instructions (Addendum)
If your pain persists, return so we can check an xray.  IF you received an x-ray today, you will receive an invoice from [facility]. Please contact [facility] at [phone] with questions or concerns regarding your invoice.   IF you received labwork today, you will receive an invoice from [employer]. Please contact [facility] at [phone] with questions or concerns regarding your invoice.   Our billing staff will not be able to assist you with questions regarding bills from these companies.  You will be contacted with the lab results as soon as they are available. The fastest way to get your results is to activate your My Chart account. Instructions are located on the last page of this paperwork. If you have not heard from [place] regarding the results in 2 weeks, please contact this office.    Low Back Strain With Rehab A strain is an injury in which a tendon or muscle is torn. The muscles and tendons of the lower back are vulnerable to strains. However, these muscles and tendons are very strong and require a great force to be injured. Strains are classified into three categories. Grade 1 strains cause pain, but the tendon is not lengthened. Grade 2 strains include a lengthened ligament, due to the ligament being stretched or partially ruptured. With grade 2 strains there is still function, although the function may be decreased. Grade 3 strains involve a complete tear of the tendon or muscle, and function is usually impaired. SYMPTOMS   Pain in the lower back.  Pain that affects one side more than the other.  Pain that gets worse with movement and may be felt in the hip, buttocks, or back of the thigh.  Muscle spasms of the muscles in the back.  Swelling along the muscles of the back.  Loss of strength of the back muscles.  Crackling sound (crepitation) when the muscles are touched. CAUSES  Lower back strains occur when a force is placed  on the muscles or tendons that is greater than they can handle. Common causes of injury include:  Prolonged overuse of the muscle-tendon units in the lower back, usually from incorrect posture.  A single violent injury or force applied to the back. RISK INCREASES WITH:  Sports that involve twisting forces on the spine or a lot of bending at the waist (football, rugby, weightlifting, bowling, golf, tennis, speed skating, racquetball, swimming, running, gymnastics, diving).  Poor strength and flexibility.  Failure to warm up properly before activity.  Family history of lower back pain or disk disorders.  Previous back injury or surgery (especially fusion).  Poor posture with lifting, especially heavy objects.  Prolonged sitting, especially with poor posture. PREVENTION   Learn and use proper posture when sitting or lifting (maintain proper posture when sitting, lift using the knees and legs, not at the waist).  Warm up and stretch properly before activity.  Allow for adequate recovery between workouts.  Maintain physical fitness:  Strength, flexibility, and endurance.  Cardiovascular fitness. PROGNOSIS  If treated properly, lower back strains usually heal within 6 weeks. RELATED COMPLICATIONS   Recurring symptoms, resulting in a chronic problem.  Chronic inflammation, scarring, and partial muscle-tendon tear.  Delayed healing or resolution of symptoms.  Prolonged disability. TREATMENT  Treatment first involves the use of ice and medicine, to reduce pain and inflammation. The use of strengthening and stretching exercises may help reduce pain with activity. These exercises may be performed at home or with a therapist. Severe  injuries may require referral to a therapist for further evaluation and treatment, such as ultrasound. Your caregiver may advise that you wear a back brace or corset, to help reduce pain and discomfort. Often, prolonged bed rest results in greater harm  then benefit. Corticosteroid injections may be recommended. However, these should be reserved for the most serious cases. It is important to avoid using your back when lifting objects. At night, sleep on your back on a firm mattress with a pillow placed under your knees. If non-surgical treatment is unsuccessful, surgery may be needed.  MEDICATION   If pain medicine is needed, nonsteroidal anti-inflammatory medicines (aspirin and ibuprofen), or other minor pain relievers (acetaminophen), are often advised.  Do not take pain medicine for 7 days before surgery.  Prescription pain relievers may be given, if your caregiver thinks they are needed. Use only as directed and only as much as you need.  Ointments applied to the skin may be helpful.  Corticosteroid injections may be given by your caregiver. These injections should be reserved for the most serious cases, because they may only be given a certain number of times. HEAT AND COLD  Cold treatment (icing) should be applied for 10 to 15 minutes every 2 to 3 hours for inflammation and pain, and immediately after activity that aggravates your symptoms. Use ice packs or an ice massage.  Heat treatment may be used before performing stretching and strengthening activities prescribed by your caregiver, physical therapist, or athletic trainer. Use a heat pack or a warm water soak. SEEK MEDICAL CARE IF:   Symptoms get worse or do not improve in 2 to 4 weeks, despite treatment.  You develop numbness, weakness, or loss of bowel or bladder function.  New, unexplained symptoms develop. (Drugs used in treatment may produce side effects.) EXERCISES  RANGE OF MOTION (ROM) AND STRETCHING EXERCISES - Low Back Strain Most people with lower back pain will find that their symptoms get worse with excessive bending forward (flexion) or arching at the lower back (extension). The exercises which will help resolve your symptoms will focus on the opposite motion.   Your physician, physical therapist or athletic trainer will help you determine which exercises will be most helpful to resolve your lower back pain. Do not complete any exercises without first consulting with your caregiver. Discontinue any exercises which make your symptoms worse until you speak to your caregiver.  If you have pain, numbness or tingling which travels down into your buttocks, leg or foot, the goal of the therapy is for these symptoms to move closer to your back and eventually resolve. Sometimes, these leg symptoms will get better, but your lower back pain may worsen. This is typically an indication of progress in your rehabilitation. Be very alert to any changes in your symptoms and the activities in which you participated in the 24 hours prior to the change. Sharing this information with your caregiver will allow him/her to most efficiently treat your condition.  These exercises may help you when beginning to rehabilitate your injury. Your symptoms may resolve with or without further involvement from your physician, physical therapist or athletic trainer. While completing these exercises, remember:  Restoring tissue flexibility helps normal motion to return to the joints. This allows healthier, less painful movement and activity.  An effective stretch should be held for at least 30 seconds.  A stretch should never be painful. You should only feel a gentle lengthening or release in the stretched tissue. FLEXION RANGE OF MOTION AND  STRETCHING EXERCISES: STRETCH - Flexion, Single Knee to Chest   Lie on a firm bed or floor with both legs extended in front of you.  Keeping one leg in contact with the floor, bring your opposite knee to your chest. Hold your leg in place by either grabbing behind your thigh or at your knee.  Pull until you feel a gentle stretch in your lower back. Hold __________ seconds.  Slowly release your grasp and repeat the exercise with the opposite  side. Repeat __________ times. Complete this exercise __________ times per day.  STRETCH - Flexion, Double Knee to Chest   Lie on a firm bed or floor with both legs extended in front of you.  Keeping one leg in contact with the floor, bring your opposite knee to your chest.  Tense your stomach muscles to support your back and then lift your other knee to your chest. Hold your legs in place by either grabbing behind your thighs or at your knees.  Pull both knees toward your chest until you feel a gentle stretch in your lower back. Hold __________ seconds.  Tense your stomach muscles and slowly return one leg at a time to the floor. Repeat __________ times. Complete this exercise __________ times per day.  STRETCH - Low Trunk Rotation  Lie on a firm bed or floor. Keeping your legs in front of you, bend your knees so they are both pointed toward the ceiling and your feet are flat on the floor.  Extend your arms out to the side. This will stabilize your upper body by keeping your shoulders in contact with the floor.  Gently and slowly drop both knees together to one side until you feel a gentle stretch in your lower back. Hold for __________ seconds.  Tense your stomach muscles to support your lower back as you bring your knees back to the starting position. Repeat the exercise to the other side. Repeat __________ times. Complete this exercise __________ times per day  EXTENSION RANGE OF MOTION AND FLEXIBILITY EXERCISES: STRETCH - Extension, Prone on Elbows   Lie on your stomach on the floor, a bed will be too soft. Place your palms about shoulder width apart and at the height of your head.  Place your elbows under your shoulders. If this is too painful, stack pillows under your chest.  Allow your body to relax so that your hips drop lower and make contact more completely with the floor.  Hold this position for __________ seconds.  Slowly return to lying flat on the floor. Repeat  __________ times. Complete this exercise __________ times per day.  RANGE OF MOTION - Extension, Prone Press Ups  Lie on your stomach on the floor, a bed will be too soft. Place your palms about shoulder width apart and at the height of your head.  Keeping your back as relaxed as possible, slowly straighten your elbows while keeping your hips on the floor. You may adjust the placement of your hands to maximize your comfort. As you gain motion, your hands will come more underneath your shoulders.  Hold this position __________ seconds.  Slowly return to lying flat on the floor. Repeat __________ times. Complete this exercise __________ times per day.  RANGE OF MOTION- Quadruped, Neutral Spine   Assume a hands and knees position on a firm surface. Keep your hands under your shoulders and your knees under your hips. You may place padding under your knees for comfort.  Drop your head and point your  tail bone toward the ground below you. This will round out your lower back like an angry cat. Hold this position for __________ seconds.  Slowly lift your head and release your tail bone so that your back sags into a large arch, like an old horse.  Hold this position for __________ seconds.  Repeat this until you feel limber in your lower back.  Now, find your "sweet spot." This will be the most comfortable position somewhere between the two previous positions. This is your neutral spine. Once you have found this position, tense your stomach muscles to support your lower back.  Hold this position for __________ seconds. Repeat __________ times. Complete this exercise __________ times per day.  STRENGTHENING EXERCISES - Low Back Strain These exercises may help you when beginning to rehabilitate your injury. These exercises should be done near your "sweet spot." This is the neutral, low-back arch, somewhere between fully rounded and fully arched, that is your least painful position. When performed in  this safe range of motion, these exercises can be used for people who have either a flexion or extension based injury. These exercises may resolve your symptoms with or without further involvement from your physician, physical therapist or athletic trainer. While completing these exercises, remember:   Muscles can gain both the endurance and the strength needed for everyday activities through controlled exercises.  Complete these exercises as instructed by your physician, physical therapist or athletic trainer. Increase the resistance and repetitions only as guided.  You may experience muscle soreness or fatigue, but the pain or discomfort you are trying to eliminate should never worsen during these exercises. If this pain does worsen, stop and make certain you are following the directions exactly. If the pain is still present after adjustments, discontinue the exercise until you can discuss the trouble with your caregiver. STRENGTHENING - Deep Abdominals, Pelvic Tilt  Lie on a firm bed or floor. Keeping your legs in front of you, bend your knees so they are both pointed toward the ceiling and your feet are flat on the floor.  Tense your lower abdominal muscles to press your lower back into the floor. This motion will rotate your pelvis so that your tail bone is scooping upwards rather than pointing at your feet or into the floor.  With a gentle tension and even breathing, hold this position for __________ seconds. Repeat __________ times. Complete this exercise __________ times per day.  STRENGTHENING - Abdominals, Crunches   Lie on a firm bed or floor. Keeping your legs in front of you, bend your knees so they are both pointed toward the ceiling and your feet are flat on the floor. Cross your arms over your chest.  Slightly tip your chin down without bending your neck.  Tense your abdominals and slowly lift your trunk high enough to just clear your shoulder blades. Lifting higher can put  excessive stress on the lower back and does not further strengthen your abdominal muscles.  Control your return to the starting position. Repeat __________ times. Complete this exercise __________ times per day.  STRENGTHENING - Quadruped, Opposite UE/LE Lift   Assume a hands and knees position on a firm surface. Keep your hands under your shoulders and your knees under your hips. You may place padding under your knees for comfort.  Find your neutral spine and gently tense your abdominal muscles so that you can maintain this position. Your shoulders and hips should form a rectangle that is parallel with the floor and  is not twisted.  Keeping your trunk steady, lift your right hand no higher than your shoulder and then your left leg no higher than your hip. Make sure you are not holding your breath. Hold this position __________ seconds.  Continuing to keep your abdominal muscles tense and your back steady, slowly return to your starting position. Repeat with the opposite arm and leg. Repeat __________ times. Complete this exercise __________ times per day.  STRENGTHENING - Lower Abdominals, Double Knee Lift  Lie on a firm bed or floor. Keeping your legs in front of you, bend your knees so they are both pointed toward the ceiling and your feet are flat on the floor.  Tense your abdominal muscles to brace your lower back and slowly lift both of your knees until they come over your hips. Be certain not to hold your breath.  Hold __________ seconds. Using your abdominal muscles, return to the starting position in a slow and controlled manner. Repeat __________ times. Complete this exercise __________ times per day.  POSTURE AND BODY MECHANICS CONSIDERATIONS - Low Back Strain Keeping correct posture when sitting, standing or completing your activities will reduce the stress put on different body tissues, allowing injured tissues a chance to heal and limiting painful experiences. The following are  general guidelines for improved posture. Your physician or physical therapist will provide you with any instructions specific to your needs. While reading these guidelines, remember:  The exercises prescribed by your provider will help you have the flexibility and strength to maintain correct postures.  The correct posture provides the best environment for your joints to work. All of your joints have less wear and tear when properly supported by a spine with good posture. This means you will experience a healthier, less painful body.  Correct posture must be practiced with all of your activities, especially prolonged sitting and standing. Correct posture is as important when doing repetitive low-stress activities (typing) as it is when doing a single heavy-load activity (lifting). RESTING POSITIONS Consider which positions are most painful for you when choosing a resting position. If you have pain with flexion-based activities (sitting, bending, stooping, squatting), choose a position that allows you to rest in a less flexed posture. You would want to avoid curling into a fetal position on your side. If your pain worsens with extension-based activities (prolonged standing, working overhead), avoid resting in an extended position such as sleeping on your stomach. Most people will find more comfort when they rest with their spine in a more neutral position, neither too rounded nor too arched. Lying on a non-sagging bed on your side with a pillow between your knees, or on your back with a pillow under your knees will often provide some relief. Keep in mind, being in any one position for a prolonged period of time, no matter how correct your posture, can still lead to stiffness. PROPER SITTING POSTURE In order to minimize stress and discomfort on your spine, you must sit with correct posture. Sitting with good posture should be effortless for a healthy body. Returning to good posture is a gradual process. Many  people can work toward this most comfortably by using various supports until they have the flexibility and strength to maintain this posture on their own. When sitting with proper posture, your ears will fall over your shoulders and your shoulders will fall over your hips. You should use the back of the chair to support your upper back. Your lower back will be in a neutral  position, just slightly arched. You may place a small pillow or folded towel at the base of your lower back for support.  When working at a desk, create an environment that supports good, upright posture. Without extra support, muscles tire, which leads to excessive strain on joints and other tissues. Keep these recommendations in mind: CHAIR:  A chair should be able to slide under your desk when your back makes contact with the back of the chair. This allows you to work closely.  The chair's height should allow your eyes to be level with the upper part of your monitor and your hands to be slightly lower than your elbows. BODY POSITION  Your feet should make contact with the floor. If this is not possible, use a foot rest.  Keep your ears over your shoulders. This will reduce stress on your neck and lower back. INCORRECT SITTING POSTURES  If you are feeling tired and unable to assume a healthy sitting posture, do not slouch or slump. This puts excessive strain on your back tissues, causing more damage and pain. Healthier options include:  Using more support, like a lumbar pillow.  Switching tasks to something that requires you to be upright or walking.  Talking a brief walk.  Lying down to rest in a neutral-spine position. PROLONGED STANDING WHILE SLIGHTLY LEANING FORWARD  When completing a task that requires you to lean forward while standing in one place for a long time, place either foot up on a stationary 2-4 inch high object to help maintain the best posture. When both feet are on the ground, the lower back tends to  lose its slight inward curve. If this curve flattens (or becomes too large), then the back and your other joints will experience too much stress, tire more quickly, and can cause pain. CORRECT STANDING POSTURES Proper standing posture should be assumed with all daily activities, even if they only take a few moments, like when brushing your teeth. As in sitting, your ears should fall over your shoulders and your shoulders should fall over your hips. You should keep a slight tension in your abdominal muscles to brace your spine. Your tailbone should point down to the ground, not behind your body, resulting in an over-extended swayback posture.  INCORRECT STANDING POSTURES  Common incorrect standing postures include a forward head, locked knees and/or an excessive swayback. WALKING Walk with an upright posture. Your ears, shoulders and hips should all line-up. PROLONGED ACTIVITY IN A FLEXED POSITION When completing a task that requires you to bend forward at your waist or lean over a low surface, try to find a way to stabilize 3 out of 4 of your limbs. You can place a hand or elbow on your thigh or rest a knee on the surface you are reaching across. This will provide you more stability so that your muscles do not fatigue as quickly. By keeping your knees relaxed, or slightly bent, you will also reduce stress across your lower back. CORRECT LIFTING TECHNIQUES DO :   Assume a wide stance. This will provide you more stability and the opportunity to get as close as possible to the object which you are lifting.  Tense your abdominals to brace your spine. Bend at the knees and hips. Keeping your back locked in a neutral-spine position, lift using your leg muscles. Lift with your legs, keeping your back straight.  Test the weight of unknown objects before attempting to lift them.  Try to keep your elbows locked down  at your sides in order get the best strength from your shoulders when carrying an  object.  Always ask for help when lifting heavy or awkward objects. INCORRECT LIFTING TECHNIQUES DO NOT:   Lock your knees when lifting, even if it is a small object.  Bend and twist. Pivot at your feet or move your feet when needing to change directions.  Assume that you can safely pick up even a paper clip without proper posture.   This information is not intended to replace advice given to you by your health care provider. Make sure you discuss any questions you have with your health care provider.   Document Released: 09/27/2005 Document Revised: 10/18/2014 Document Reviewed: 01/09/2009 [employer] Interactive Patient Education [employer].

## 2016-06-01 ENCOUNTER — Ambulatory Visit (INDEPENDENT_AMBULATORY_CARE_PROVIDER_SITE_OTHER): Payer: 59 | Admitting: Gastroenterology

## 2016-06-01 ENCOUNTER — Encounter: Payer: Self-pay | Admitting: Gastroenterology

## 2016-06-01 VITALS — BP 144/78 | HR 78 | Ht 67.0 in | Wt 193.0 lb

## 2016-06-01 DIAGNOSIS — K602 Anal fissure, unspecified: Secondary | ICD-10-CM

## 2016-06-01 DIAGNOSIS — K219 Gastro-esophageal reflux disease without esophagitis: Secondary | ICD-10-CM | POA: Diagnosis not present

## 2016-06-01 DIAGNOSIS — R1032 Left lower quadrant pain: Secondary | ICD-10-CM

## 2016-06-01 MED ORDER — AMBULATORY NON FORMULARY MEDICATION: 1 refills | Status: DC

## 2016-06-01 MED ORDER — RANITIDINE HCL 150 MG PO TABS: 150.0000 mg | ORAL_TABLET | Freq: Two times a day (BID) | ORAL | 11 refills | Status: DC

## 2016-06-01 NOTE — Patient Instructions (Signed)
Stop taking your Prilosec.   We have sent the following medications to your pharmacy for you to pick up at your convenience: Zantac 150 mg twice daily.   Also we sent a medication called nitroglycerin ointment to [facility] in [facility] to use three times a day x 8 weeks or until anal fissure is healed.

## 2016-06-01 NOTE — Progress Notes (Signed)
HPI :  65 y/o male with a history of IBS, anal fissure, GERD, and hemorrhoids, here for a visit to establish care, he has not been seen since 2013. Previously seen by Dr. Deatra Ina for history of reported IBS and anal fissure in 2013  He reports some rectal bleeding once every 2-3 months. He sees bright red blood to pink discoloration, which he thinks mostly on the toilet paper but on occasion in the toilet bowl. He reports it is usually painless when this occurs but can have some perianal pain. He thinks symptoms ongoing for "years". He thinks it started since his "double hernia" repair which led to constipation for a while. He has a history of anal fissure. He took some topical nitroglycerin which took a while to heal it up back in 2013   He has roughly one BM per day, sometimes twice. No straining. No hard stools, stool form is usually soft. Bleeding is painless. He has some some left inguinal pain which has persisted, he thinks it is there 24/7, constantly. He had a surgical evaluatoin for a possible hernia in the area but was told he did not have a hernia. He is not sure if a bowel movement affects it, he thinks maybe makes it worse. He is not sure if lifting anything can make it worse. He has had a CT Scan of the abdomen in June which did not show any pathology to account for his symptoms.   He otherwise takes prilosec 40mg  told to take M/W/Fri but taking it probably 2 days per week. He reports having heartburn periodically, states it can come and go. He thinks he has symptoms on most days of the week. He uses TUMS PRN which works well for him. He is trying to avoid higher doses due to potential side effects. He has been on prevacid in the past as well.   Colonoscopy 01/25/2012 - normal exam, biopsies ruled out microscopic colitis, told to repeat in 10 years EGD 01/25/2012 - mild esophagitis  Labs 03/2016: Hgb 14 Normal LFTs Normal renal function   Past Medical History:  Diagnosis Date  .  Allergy   . Cancer (HCC)    basal cell CA- forehead  . GERD (gastroesophageal reflux disease)   . Hemorrhoids   . Hernia, inguinal   . Hiatal hernia   . Hypertension   . IBS (irritable bowel syndrome)   . Ulcer      Past Surgical History:  Procedure Laterality Date  . FINGER SURGERY     Left Pinky finger  . INGUINAL HERNIA REPAIR     bilateral  . NASAL SEPTUM SURGERY    . right knee surgery    . TONSILLECTOMY AND ADENOIDECTOMY     Family History  Problem Relation Age of Onset  . Heart disease Father   . Heart disease Brother   . Colon cancer Neg Hx   . Esophageal cancer Neg Hx   . Rectal cancer Neg Hx   . Stomach cancer Neg Hx    Social History  Substance Use Topics  . Smoking status: Former Smoker    Quit date: 10/11/1980  . Smokeless tobacco: Never Used  . Alcohol use 0.0 oz/week     Comment: 2-3 drinks per MONTH   Current Outpatient Prescriptions  Medication Sig Dispense Refill  . omeprazole (PRILOSEC) 40 MG capsule Take 1 capsule (40 mg total) by mouth daily. 30 minutes before dinner (Patient taking differently: Take 40 mg by mouth 3 (three) times  a week. 30 minutes before dinner. MWF) 30 capsule 0  . valsartan-hydrochlorothiazide (DIOVAN-HCT) 160-12.5 MG per tablet Take by mouth. Take 1/2 tablet by mouth MWF.     No current facility-administered medications for this visit.    No Known Allergies   Review of Systems: All systems reviewed and negative except where noted in HPI.    Labs per HPI above  Physical Exam: BP (!) 144/78   Pulse 78   Ht 5\' 7"  (1.702 m)   Wt 193 lb 0.4 oz (87.6 kg)   BMI 30.23 kg/m  Constitutional: Pleasant,well-developed, male in no acute distress. HEENT: Normocephalic and atraumatic. Conjunctivae are normal. No scleral icterus. Neck supple.  Cardiovascular: Normal rate, regular rhythm.  Pulmonary/chest: Effort normal and breath sounds normal. No wheezing, rales or rhonchi. Abdominal: Soft, nondistended, nontender. Bowel  sounds active throughout. There are no masses palpable. No hepatomegaly. Left inguinal area - no mass lesion, no obvious hernia, negative [place] DRE - anal fissure within posterior anal canal, no mass lesion, no anoscopy performed given pain with digital exam Extremities: no edema Lymphadenopathy: No cervical adenopathy noted. Neurological: Alert and oriented to person place and time. Skin: Skin is warm and dry. No rashes noted. Psychiatric: Normal mood and affect. Behavior is normal.   ASSESSMENT AND PLAN: 65 y/o male here for evaluation of the following issues:  Rectal bleeding / anal fissure noted on perianal exam which is the likely cause. Recommend topical 0.125% nitroglycerin TID, daily fiber supplement. No anemia, relatively recent colonoscopy done, reassured patient. If symptoms persist despite this regimen he can follow up for reassessment.   GERD - taking PPI roughly 2 days per week and not helping too much. I discussed options to include daily PPI, H2 blocker, OTC regimens, and discussed risks / benefits of each. His preference is to try zantac 150mg  BID following this discussion. For breakthrough he can try Maalox. If he is not improved on this regimen and continues to have symptoms he can try daily PPI. Prior EGD without Barrett's. Follow up PRN for this issue.   Inguinal pain - this does not appear to be abdominal pain, pain centers in the inguinal canal. Seen by surgery who did not appreciate a hernia and neither did I on exam. His CT does not show pathology in the abdomen to account for this. I suspect he may have a musculoskeletal strain and discussed some conservative therapies for this. Follow up with primary care if this persists.   [place] Cellar, MD [facility] Pager [phone]  CC: Deland Pretty, MD

## 2017-01-17 ENCOUNTER — Ambulatory Visit (INDEPENDENT_AMBULATORY_CARE_PROVIDER_SITE_OTHER): Payer: 59 | Admitting: Gastroenterology

## 2017-01-17 ENCOUNTER — Encounter: Payer: Self-pay | Admitting: Gastroenterology

## 2017-01-17 ENCOUNTER — Encounter (INDEPENDENT_AMBULATORY_CARE_PROVIDER_SITE_OTHER): Payer: Self-pay

## 2017-01-17 VITALS — BP 152/74 | HR 64 | Ht 67.0 in | Wt 197.1 lb

## 2017-01-17 DIAGNOSIS — K602 Anal fissure, unspecified: Secondary | ICD-10-CM | POA: Diagnosis not present

## 2017-01-17 DIAGNOSIS — K219 Gastro-esophageal reflux disease without esophagitis: Secondary | ICD-10-CM | POA: Diagnosis not present

## 2017-01-17 NOTE — Progress Notes (Signed)
HPI :  66 year old male with a history of GERD and anal fissure, here for a follow-up visit.  At the last visit he reported recurrent rectal bleeding with some perianal pain. He was noted to have an anal fissure on DRE was treated with topical nitroglycerin for several weeks. He reports this significantly improved his symptoms. He not had further bleeding like he had previously. He reports overall doing much better since his last visit. Not using fiber supplement, no constipation noted.   Otherwise at his last visit she was having frequent symptoms of heartburn despite taking omeprazole 20 mg a few days a week . We discussed risk benefits of reflux regimens at the last visit and he wished to tried Zantac twice day every day. He has been using zantac which did not help reflux at all, states he felt worse. He has since resumed taking omeprazole 20mg  once or twice per week. He had been using TUMS. His main symptom is pyrosis. He continues to feel heartburn daily, regardless of the food he takes. [place] based foods can bother him. He has not yet tried daily use of PPI. No dysphagia. No weight loss. He has gained weight.  Colonoscopy 01/25/2012 - normal exam, biopsies ruled out microscopic colitis, told to repeat in 10 years EGD 01/25/2012 - mild esophagitis, no evidence of Barrett's  Labs 03/2016: Hgb 14 Normal LFTs Normal renal function   Past Medical History:  Diagnosis Date  . Allergy   . Anal fissure   . Cancer (HCC)    basal cell CA- forehead  . GERD (gastroesophageal reflux disease)   . Hemorrhoids   . Hernia, inguinal   . Hiatal hernia   . Hypertension   . IBS (irritable bowel syndrome)   . Ulcer [facility])      Past Surgical History:  Procedure Laterality Date  . FINGER SURGERY     Left Pinky finger  . INGUINAL HERNIA REPAIR     bilateral  . NASAL SEPTUM SURGERY    . right knee surgery    . TONSILLECTOMY AND ADENOIDECTOMY     Family History  Problem Relation Age of Onset    . Heart disease Father   . Heart disease Brother   . Colon cancer Neg Hx   . Esophageal cancer Neg Hx   . Rectal cancer Neg Hx   . Stomach cancer Neg Hx    Social History  Substance Use Topics  . Smoking status: Former Smoker    Quit date: 10/11/1980  . Smokeless tobacco: Never Used  . Alcohol use 0.0 oz/week     Comment: 2-3 drinks per MONTH   Current Outpatient Prescriptions  Medication Sig Dispense Refill  . omeprazole (PRILOSEC) 20 MG capsule Take 20 mg by mouth daily. Takes as needed    . simvastatin (ZOCOR) 20 MG tablet Take 20 mg by mouth daily.  0  . tamsulosin (FLOMAX) 0.4 MG CAPS capsule Take 0.4 mg by mouth once.    . valsartan-hydrochlorothiazide (DIOVAN-HCT) 160-12.5 MG per tablet Take 1 tablet by mouth daily.      No current facility-administered medications for this visit.    No Known Allergies   Review of Systems: All systems reviewed and negative except where noted in HPI.   Labs as above  Physical Exam: BP (!) 152/74 (BP Location: Left Arm, Patient Position: Sitting, Cuff Size: Normal)   Pulse 64   Ht 5\' 7"  (1.702 m) Comment: height measured without shoes  Wt 197 lb 2 oz (  89.4 kg)   BMI 30.87 kg/m  Constitutional: Pleasant,well-developed, male in no acute distress. HEENT: Normocephalic and atraumatic. Conjunctivae are normal. No scleral icterus. Neck supple.  Cardiovascular: Normal rate, regular rhythm.  Pulmonary/chest: Effort normal and breath sounds normal. No wheezing, rales or rhonchi. Abdominal: Soft, nondistended, nontender.  There are no masses palpable. No hepatomegaly. DRE - no anal fissure, healed, external skin tags and hemorrhoids Extremities: no edema Lymphadenopathy: No cervical adenopathy noted. Neurological: Alert and oriented to person place and time. Skin: Skin is warm and dry. No rashes noted. Psychiatric: Normal mood and affect. Behavior is normal.   ASSESSMENT AND PLAN: 66 year old male here for reassessment of the  following issues:  GERD - since his last visit he did not respond well to Zantac twice daily with significant breakthrough. Now back on low-dose omeprazole however using it only once to twice a week with significant symptoms. He's had a prior EGD which did not show any obvious Barrett's esophagus, with very mild esophagitis at the GEJ. I discussed risks and benefits of PPIs with him and recommend higher-dose PPI daily to minimize the symptoms. He was agreeable to 20 mg twice daily omeprazole for a few weeks and see how he does. If he has significant breakthrough despite this he should contact me and we'll consider higher dose PPI along with possible EGD. If he has resolution of symptoms on this dose he can consider decreasing to omeprazole 20 mg once daily. Counseled that I do think he warrants daily PPI given his course to date. All questions answered he agreed with the plan.  Anal fissure  / external hemorrhoids - fissure healed status post treatment with nitroglycerin ointment. Recommend daily fiber supplement and avoid straining to minimize risk of recurrence. He can follow-up as needed if symptoms recur. Recall colonoscopy in 2023 for screening purposes.  [place] Cellar, MD [facility] Pager [phone]

## 2017-01-17 NOTE — Patient Instructions (Signed)
If you are age 66 or older, your body mass index should be between 23-30. Your Body mass index is 30.87 kg/m. If this is out of the aforementioned range listed, please consider follow up with your Primary Care Provider.  If you are age 69 or younger, your body mass index should be between 19-25. Your Body mass index is 30.87 kg/m. If this is out of the aformentioned range listed, please consider follow up with your Primary Care Provider.   Please increase your Omperazole to 20mg  twice daily.  Please purchase an over the counter fiber supplement.   Please follow up as needed.  Thank you,.

## 2017-07-28 ENCOUNTER — Ambulatory Visit (INDEPENDENT_AMBULATORY_CARE_PROVIDER_SITE_OTHER): Payer: 59 | Admitting: Physician Assistant

## 2017-07-28 ENCOUNTER — Encounter: Payer: Self-pay | Admitting: Physician Assistant

## 2017-07-28 ENCOUNTER — Encounter (INDEPENDENT_AMBULATORY_CARE_PROVIDER_SITE_OTHER): Payer: Self-pay

## 2017-07-28 ENCOUNTER — Telehealth: Payer: Self-pay | Admitting: Gastroenterology

## 2017-07-28 VITALS — BP 140/70 | HR 56 | Ht 67.0 in | Wt 196.5 lb

## 2017-07-28 DIAGNOSIS — K602 Anal fissure, unspecified: Secondary | ICD-10-CM

## 2017-07-28 DIAGNOSIS — R1013 Epigastric pain: Secondary | ICD-10-CM | POA: Diagnosis not present

## 2017-07-28 DIAGNOSIS — K219 Gastro-esophageal reflux disease without esophagitis: Secondary | ICD-10-CM

## 2017-07-28 DIAGNOSIS — K625 Hemorrhage of anus and rectum: Secondary | ICD-10-CM | POA: Diagnosis not present

## 2017-07-28 MED ORDER — AMBULATORY NON FORMULARY MEDICATION
0.1250 mg | Freq: Three times a day (TID) | 1 refills | Status: DC
Start: 2017-07-28 — End: 2018-04-11

## 2017-07-28 MED ORDER — NA SULFATE-K SULFATE-MG SULF 17.5-3.13-1.6 GM/177ML PO SOLN: 1.0000 | ORAL | 0 refills | Status: DC

## 2017-07-28 NOTE — Telephone Encounter (Signed)
Spoke to patient he had one episode of rectal bleeding this morning with a bm. He drove to our office today, he did check and he is not having any active bleeding at this moment, he is not having any other symptoms (dyspnea, dizziness). He admits to having intermittent rectal bleeding this past year. I have scheduled him to see one our of APP's this afternoon. He understands that if symptoms come back or increase in severity to go to the ED.

## 2017-07-28 NOTE — Progress Notes (Signed)
Chief Complaint: Rectal bleeding, GERD  HPI:  [name] is a 66 year old Caucasian male with a past medical history of an anal fissure as well as others listed below, who presents to clinic today with a complaint of rectal bleeding and reflux .      Patient was last seen in clinic 01/17/17 by Dr. Havery Moros and was following up on an anal fissure which had healed at that time with Nitroglycerin. Patient also had concerns regarding reflux. It was explained that if he continued have breakthrough symptoms despite Omeprazole than a possible EGD could be ordered. Last colonoscopy was noted 01/25/12 by Dr. Deatra Ina and was normal.    Today, the patient describes that this morning he had a bowel movement and while having this he needed to slightly strain and had some pain and irritation at that time and saw a large amount of bright red blood in the toilet. This scared him as this was "more than I have ever seen before". Patient describes continued spotting of bright red blood today on his underwear. He did have a further bowel movement this afternoon and did not see any blood. Patient continues with some rectal discomfort. He also describes occasional bloating and some constipation. He does tell me that he has seen occasional brb on the toilet paper over the past few months.   Patient also describes that he continues with reflux symptoms. Currently, he is taking Omeprazole Monday Wednesday and Friday 20 mg at a time and using Tums in between for breakthrough symptoms. Does complain of occasional epigastric pain with this.   Patient denies fever, chills, melena, weight loss, anorexia, dysphagia or symptoms that awaken him at night.  Past Medical History:  Diagnosis Date  . Allergy   . Anal fissure   . Cancer (HCC)    basal cell CA- forehead  . GERD (gastroesophageal reflux disease)   . Hemorrhoids   . Hernia, inguinal   . Hiatal hernia   . Hypertension   . IBS (irritable bowel syndrome)   . Ulcer      Past Surgical History:  Procedure Laterality Date  . FINGER SURGERY     Left Pinky finger  . INGUINAL HERNIA REPAIR     bilateral  . NASAL SEPTUM SURGERY    . right knee surgery    . TONSILLECTOMY AND ADENOIDECTOMY      Current Outpatient Prescriptions  Medication Sig Dispense Refill  . losartan-hydrochlorothiazide (HYZAAR) 100-12.5 MG tablet Take 1 tablet by mouth daily.  6  . omeprazole (PRILOSEC) 20 MG capsule Take 20 mg by mouth. OTC Mon, Wed, Frid    . AMBULATORY NON FORMULARY MEDICATION Place 0.125 mg rectally 3 (three) times daily. Medication Name: Nitroglycerin ointment 1 Tube 1  . Na Sulfate-K Sulfate-Mg Sulf (SUPREP BOWEL PREP KIT) 17.5-3.13-1.6 GM/177ML SOLN Take 1 kit by mouth as directed. 324 mL 0   No current facility-administered medications for this visit.     Allergies as of 07/28/2017  . (No Known Allergies)    Family History  Problem Relation Age of Onset  . Heart disease Father   . Heart disease Brother   . Colon cancer Neg Hx   . Esophageal cancer Neg Hx   . Rectal cancer Neg Hx   . Stomach cancer Neg Hx     Social History   Social History  . Marital status: Married    Spouse name: N/A  . Number of children: 2  . Years of education: N/A  Occupational History  . retired    Social History Main Topics  . Smoking status: Former Smoker    Quit date: 10/11/1980  . Smokeless tobacco: Never Used  . Alcohol use 0.0 oz/week     Comment: 2-3 drinks per MONTH  . Drug use: No  . Sexual activity: Not on file   Other Topics Concern  . Not on file   Social History Narrative  . No narrative on file    Review of Systems:    Constitutional: No weight loss, fever or chills Cardiovascular: No chest pain Respiratory: No SOB  Gastrointestinal: See HPI and otherwise negative   Physical Exam:  Vital signs: BP 140/70 (BP Location: Left Arm, Patient Position: Sitting, Cuff Size: Normal)   Pulse (!) 56   Ht 5' 7"  (1.702 m)   Wt 196 lb 8 oz (89.1  kg)   BMI 30.78 kg/m   Constitutional:   Pleasant Caucasian male appears to be slightly anxious, Well developed, Well nourished, alert and cooperative Respiratory: Respirations even and unlabored. Lungs clear to auscultation bilaterally.   No wheezes, crackles, or rhonchi.  Cardiovascular: Normal S1, S2. No MRG. Regular rate and rhythm. No peripheral edema, cyanosis or pallor.  Gastrointestinal:  Soft, nondistended, nontender. No rebound or guarding. Normal bowel sounds. No appreciable masses or hepatomegaly. Rectal:  External Exam: small hemorrhoid tag, inflamed-partially prolapsing internal hemorrhoids and deep inferior fissure, ttp; Internal exam and Anoscopy were not done today due to pain with slight spread of buttocks and anal sphincter spasm related to this Psychiatric: Demonstrates good judgement and reason without abnormal affect or behaviors.  No recent labs or imaging.  Assessment: 1. Anal fissure: Occurred today during first bowel movement, slight straining per patient, large amount of bright red blood at that time, continued with spotting throughout the day, pain with palpation, large fissure seen at time of exam 2. Rectal bleeding: With above 3. GERD: Continued, patient currently on Omeprazole 20 mg Monday Wednesday and Friday with Tums in between, would like further evaluation for underlying disease/"cancer" with EGD at time of colonoscopy 4. Epigastric abdominal pain: occasionally with above  Plan: 1. Discussed case with Dr. Havery Moros. Recommended proceeding with a colonoscopy due to repeat fissure. Scheduled patient for a colonoscopy in the [place]. Did discuss risks, benefits, limitations and alternatives and the patient agrees to proceed. 2. Prescribed Nitroglycerin ointment to be applied to the rectum 3 times daily 6-8 weeks 3. Discussed underlying problem of a fissure is constipation and straining, recommend patient start a daily stool softener and/or daily dose of MiraLAX to  attain soft regular bowel movements 4. Patient requests an EGD at this time as well due to continued reflux symptoms, per Dr. Doyne Keel last note, this was the next step versus increasing PPI. Scheduled patient for an EGD at time of colonoscopy above. 5. Patient to continue his omeprazole 20 mg, recommend he take this on a daily basis. 6. Patient to return to clinic per recommendations form Dr. Havery Moros after procedures  Ellouise Newer, PA-C [facility] 07/28/2017, 2:41 PM  Cc: Anda Kraft, MD

## 2017-07-28 NOTE — Telephone Encounter (Signed)
Okay thanks Almyra Free, we will await evaluation by APP. He has a history of anal fissure, he needs to be examined for this

## 2017-07-28 NOTE — Patient Instructions (Addendum)
You have been scheduled for an endoscopy and colonoscopy. Please follow the written instructions given to you at your visit today. Please pick up your prep supplies at the pharmacy within the next 1-3 days. If you use inhalers (even only as needed), please bring them with you on the day of your procedure. Your physician has requested that you go to www.startemmi.com and enter the access code given to you at your visit today. This web site gives a general overview about your procedure. However, you should still follow specific instructions given to you by our office regarding your preparation for the procedure.  We have sent a prescription for nitroglycerin 0.125% gel to [facility]. You should apply a pea size amount to your rectum three times daily x 6 weeks.  [facility]'s information is below: Address: [address], [place], [place] [address]  [phone]  We have given you a high fiber diet handout. Please strive to have 25-30 grams of fiber daily.   Your provider suggest that you drink more water. Try to have at least 6-8 8 oz glasses of water daily.   Please purchase the following medications over the counter and take as directed: Colace, Miralax or Dulcolax once daily.

## 2017-07-28 NOTE — Progress Notes (Signed)
Agree with assessment and plan as outlined. Agree that his bleeding is likely due to recurrent fissure, however he's had periodic intermittent bleeding, recurrent anal fissures, I think a colonoscopy is reasonable since his last exam was > 5 years ago to ensure no other pathology. Use nitroglycerin ointment TID in the interim to treat fissure.

## 2017-08-18 ENCOUNTER — Encounter: Payer: Self-pay | Admitting: Gastroenterology

## 2017-08-29 ENCOUNTER — Other Ambulatory Visit: Payer: Self-pay

## 2017-08-29 ENCOUNTER — Encounter: Payer: Self-pay | Admitting: Gastroenterology

## 2017-08-29 ENCOUNTER — Ambulatory Visit (AMBULATORY_SURGERY_CENTER): Payer: 59 | Admitting: Gastroenterology

## 2017-08-29 VITALS — BP 142/75 | HR 65 | Temp 98.2°F | Resp 15 | Ht 67.0 in | Wt 196.0 lb

## 2017-08-29 DIAGNOSIS — K219 Gastro-esophageal reflux disease without esophagitis: Secondary | ICD-10-CM

## 2017-08-29 DIAGNOSIS — K210 Gastro-esophageal reflux disease with esophagitis: Secondary | ICD-10-CM | POA: Diagnosis not present

## 2017-08-29 DIAGNOSIS — K625 Hemorrhage of anus and rectum: Secondary | ICD-10-CM | POA: Diagnosis not present

## 2017-08-29 DIAGNOSIS — K602 Anal fissure, unspecified: Secondary | ICD-10-CM

## 2017-08-29 DIAGNOSIS — K3189 Other diseases of stomach and duodenum: Secondary | ICD-10-CM

## 2017-08-29 MED ORDER — SODIUM CHLORIDE 0.9 % IV SOLN: 500.0000 mL | INTRAVENOUS | Status: DC

## 2017-08-29 NOTE — Patient Instructions (Signed)
YOU HAD AN ENDOSCOPIC PROCEDURE TODAY AT [facility]:   Refer to the procedure report that was given to you for any specific questions about what was found during the examination.  If the procedure report does not answer your questions, please call your gastroenterologist to clarify.  If you requested that your care partner not be given the details of your procedure findings, then the procedure report has been included in a sealed envelope for you to review at your convenience later.  YOU SHOULD EXPECT: Some feelings of bloating in the abdomen. Passage of more gas than usual.  Walking can help get rid of the air that was put into your GI tract during the procedure and reduce the bloating. If you had a lower endoscopy (such as a colonoscopy or flexible sigmoidoscopy) you may notice spotting of blood in your stool or on the toilet paper. If you underwent a bowel prep for your procedure, you may not have a normal bowel movement for a few days.  Please Note:  You might notice some irritation and congestion in your nose or some drainage.  This is from the oxygen used during your procedure.  There is no need for concern and it should clear up in a day or so.  SYMPTOMS TO REPORT IMMEDIATELY:   Following lower endoscopy (colonoscopy or flexible sigmoidoscopy):  Excessive amounts of blood in the stool  Significant tenderness or worsening of abdominal pains  Swelling of the abdomen that is new, acute  Fever of 100F or higher   Following upper endoscopy (EGD)  Vomiting of blood or coffee ground material  New chest pain or pain under the shoulder blades  Painful or persistently difficult swallowing  New shortness of breath  Fever of 100F or higher  Black, tarry-looking stools  For urgent or emergent issues, a gastroenterologist can be reached at any hour by calling [phone].   DIET:  We do recommend a small meal at first, but then you may proceed to your regular diet.  Drink  plenty of fluids but you should avoid alcoholic beverages for 24 hours.  ACTIVITY:  You should plan to take it easy for the rest of today and you should NOT DRIVE or use heavy machinery until tomorrow (because of the sedation medicines used during the test).    FOLLOW UP: Our staff will call the number listed on your records the next business day following your procedure to check on you and address any questions or concerns that you may have regarding the information given to you following your procedure. If we do not reach you, we will leave a message.  However, if you are feeling well and you are not experiencing any problems, there is no need to return our call.  We will assume that you have returned to your regular daily activities without incident.  If any biopsies were taken you will be contacted by phone or by letter within the next 1-3 weeks.  Please call us at [phone] if you have not heard about the biopsies in 3 weeks.    SIGNATURES/CONFIDENTIALITY: You and/or your care partner have signed paperwork which will be entered into your electronic medical record.  These signatures attest to the fact that that the information above on your After Visit Summary has been reviewed and is understood.  Full responsibility of the confidentiality of this discharge information lies with you and/or your care-partner.    Handouts were given to your care partner on polyps,  diverticulosis, hiatal hernia and GERD. Continue topical nitroglycerin ointment three times daily for another 6 - 8 weeks. Daily fiber supplement, keep stools soft, minimize straining. Follow up in clinic in 1 month if symptoms continue to persist.If symptoms persist despite healing of fissure, consider hemorrhoid banding.  Recommend daily use of omeprazole given esophagitis noted on the exam. You may resume your current medications today. Await biopsy results. Please call if any questions or concerns.

## 2017-08-29 NOTE — Progress Notes (Signed)
No problems noted in the recovery room. maw 

## 2017-08-29 NOTE — Op Note (Signed)
[facility] Patient Name: John Bird Procedure Date: 08/29/2017 2:48 PM MRN: [id number] Endoscopist: Remo Lipps P. Armbruster MD, MD Age: 66 Referring MD:  Date of Birth: 1951-05-04 Gender: Male Account #: [id number] Procedure:                Upper GI endoscopy Indications:              longstanding heartburn, on omeprazole a few times                            per week Medicines:                Monitored Anesthesia Care Procedure:                Pre-Anesthesia Assessment:                           - Prior to the procedure, a History and Physical                            was performed, and patient medications and                            allergies were reviewed. The patient's tolerance of                            previous anesthesia was also reviewed. The risks                            and benefits of the procedure and the sedation                            options and risks were discussed with the patient.                            All questions were answered, and informed consent                            was obtained. Prior Anticoagulants: The patient has                            taken no previous anticoagulant or antiplatelet                            agents. ASA Grade Assessment: II - A patient with                            mild systemic disease. After reviewing the risks                            and benefits, the patient was deemed in                            satisfactory condition to undergo the procedure.  After obtaining informed consent, the endoscope was                            passed under direct vision. Throughout the                            procedure, the patient's blood pressure, pulse, and                            oxygen saturations were monitored continuously. The                            Endoscope was introduced through the mouth, and                            advanced to the second part of duodenum.  The upper                            GI endoscopy was accomplished without difficulty.                            The patient tolerated the procedure well. Scope In: Scope Out: Findings:                 Esophagogastric landmarks were identified: the                            Z-line was found at 36 cm, the gastroesophageal                            junction was found at 36 cm and the upper extent of                            the gastric folds was found at 39 cm from the                            incisors.                           A 3 cm hiatal hernia was present.                           Mild esophagitis was found at the distal esophagus.                           A single 4 mm superficial nodule was found at the                            gastroesophageal junction, suspect inflammatory in                            nature. Biopsies were taken with a cold forceps for  histology to ensure normal.                           The exam of the esophagus was otherwise normal.                           The entire examined stomach was normal.                           The duodenal bulb and second portion of the                            duodenum were normal. Complications:            No immediate complications. Estimated blood loss:                            Minimal. Estimated Blood Loss:     Estimated blood loss was minimal. Impression:               - Esophagogastric landmarks identified.                           - 3 cm hiatal hernia.                           - Reflux esophagitis.                           - Benign appearing nodule found in the esophagus,                            suspect reactive to reflux / inflammatory. Biopsied.                           - Normal stomach.                           - Normal duodenal bulb and second portion of the                            duodenum. Recommendation:           - Patient has a contact number available for                             emergencies. The signs and symptoms of potential                            delayed complications were discussed with the                            patient. Return to normal activities tomorrow.                            Written discharge instructions were provided to the  patient.                           - Resume previous diet.                           - Continue present medications.                           - Recommend daily use of omeprazole given                            esophagitis noted on this exam                           - Await pathology results. Remo Lipps P. Armbruster MD, MD 08/29/2017 3:26:53 PM This report has been signed electronically.

## 2017-08-29 NOTE — Progress Notes (Signed)
To recovery, report to RN, VSS. 

## 2017-08-29 NOTE — Progress Notes (Signed)
Called to room to assist during endoscopic procedure.  Patient ID and intended procedure confirmed with present staff. Received instructions for my participation in the procedure from the performing physician.  

## 2017-08-29 NOTE — Op Note (Signed)
[facility] Patient Name: John Bird Procedure Date: 08/29/2017 2:48 PM MRN: [id number] Endoscopist: Remo Lipps P. Kilea Mccarey MD, MD Age: 66 Referring MD:  Date of Birth: 1951-02-09 Gender: Male Account #: [id number] Procedure:                Colonoscopy Indications:              Hematochezia, history of recurrent anal fissure Medicines:                Monitored Anesthesia Care Procedure:                Pre-Anesthesia Assessment:                           - Prior to the procedure, a History and Physical                            was performed, and patient medications and                            allergies were reviewed. The patient's tolerance of                            previous anesthesia was also reviewed. The risks                            and benefits of the procedure and the sedation                            options and risks were discussed with the patient.                            All questions were answered, and informed consent                            was obtained. Prior Anticoagulants: The patient has                            taken no previous anticoagulant or antiplatelet                            agents. ASA Grade Assessment: II - A patient with                            mild systemic disease. After reviewing the risks                            and benefits, the patient was deemed in                            satisfactory condition to undergo the procedure.                           After obtaining informed consent, the colonoscope  was passed under direct vision. Throughout the                            procedure, the patient's blood pressure, pulse, and                            oxygen saturations were monitored continuously. The                            Colonoscope was introduced through the anus and                            advanced to the the terminal ileum, with                            identification of  the appendiceal orifice and IC                            valve. The colonoscopy was performed without                            difficulty. The patient tolerated the procedure                            well. The quality of the bowel preparation was                            good. The terminal ileum, ileocecal valve,                            appendiceal orifice, and rectum were photographed. Scope In: 3:03:06 PM Scope Out: 3:15:02 PM Scope Withdrawal Time: 0 hours 10 minutes 48 seconds  Total Procedure Duration: 0 hours 11 minutes 56 seconds  Findings:                 The perianal exam findings include anal fissure in                            the posterior anal canal and an external skin tag.                           The terminal ileum appeared normal.                           A few small-mouthed diverticula were found in the                            sigmoid colon.                           Internal hemorrhoids were found during retroflexion                            and were moderate in size.  The exam was otherwise without abnormality. No                            polyps Complications:            No immediate complications. Estimated blood loss:                            None. Estimated Blood Loss:     Estimated blood loss: none. Impression:               - Anal fissure and perianal skin tag found on                            perianal exam.                           - The examined portion of the ileum was normal.                           - Diverticulosis in the sigmoid colon.                           - Internal hemorrhoids.                           - The examination was otherwise normal.                           - No polyps                           Overall, suspect patient's symptoms most likely due                            to anal fissure +/- internal hemorrhoids. Recommendation:           - Patient has a contact number available for                             emergencies. The signs and symptoms of potential                            delayed complications were discussed with the                            patient. Return to normal activities tomorrow.                            Written discharge instructions were provided to the                            patient.                           - Resume previous diet.                           -  Continue present medications.                           - Continue topical nitroglycerin ointment three                            times daily                           - Daily fiber supplement, keep stools soft,                            minimize straining                           - Follow up in the clinic in 1 month if symptoms                            continue to persist. If symptoms persist despite                            healing of fissure, consider hemorrhoid banding.                           - Repeat colonoscopy in 10 years for screening                            purposes. Remo Lipps P. Trisha Ken MD, MD 08/29/2017 3:21:04 PM This report has been signed electronically.

## 2017-08-30 ENCOUNTER — Telehealth: Payer: Self-pay | Admitting: *Deleted

## 2017-08-30 NOTE — Telephone Encounter (Signed)
  Follow up Call-  Call back number 08/29/2017  Post procedure Call Back phone  # [phone]  Permission to leave phone message Yes  Some recent data might be hidden     Patient questions:  Do you have a fever, pain , or abdominal swelling? No. Pain Score  0 *  Have you tolerated food without any problems? Yes.    Have you been able to return to your normal activities? Yes.    Do you have any questions about your discharge instructions: Diet   No. Medications  No. Follow up visit  No.  Do you have questions or concerns about your Care? No.  Actions: * If pain score is 4 or above: No action needed, pain <4.

## 2017-09-08 ENCOUNTER — Encounter: Payer: Self-pay | Admitting: Gastroenterology

## 2018-03-21 ENCOUNTER — Encounter: Payer: Self-pay | Admitting: Gastroenterology

## 2018-04-11 ENCOUNTER — Encounter: Payer: Self-pay | Admitting: Gastroenterology

## 2018-04-11 ENCOUNTER — Ambulatory Visit: Payer: 59 | Admitting: Nurse Practitioner

## 2018-04-11 ENCOUNTER — Ambulatory Visit: Payer: 59 | Admitting: Gastroenterology

## 2018-04-11 VITALS — BP 138/70 | HR 72 | Ht 67.0 in | Wt 194.1 lb

## 2018-04-11 DIAGNOSIS — K602 Anal fissure, unspecified: Secondary | ICD-10-CM | POA: Diagnosis not present

## 2018-04-11 DIAGNOSIS — K648 Other hemorrhoids: Secondary | ICD-10-CM | POA: Diagnosis not present

## 2018-04-11 MED ORDER — AMBULATORY NON FORMULARY MEDICATION: 3 refills | Status: DC

## 2018-04-11 NOTE — Progress Notes (Signed)
HPI :  67 year old male here for a follow-up visit. He is a history of GERD/anal fissure/internal hemorrhoids.  He's been treated for recurrent anal fissure a few times last year. He underwent a colonoscopy with me in November to ensure no other pathology. At that time he did not have any polyps, he had anal fissure noted as well as moderate size internal hemorrhoids. He was treated with nitroglycerin ointment and he states this worked well to treat his symptoms. He unfortunately has had recurrent symptoms for the past few months. He does have some pain with bowel movements that is localized in the perianal area. He had some intermittent bleeding symptoms associated with this a few months ago, which stopped for a period of time, then had some recurrent blood noted on the tissue paper this morning. He denies any constipation at bothering him much. He is not straining. He did use Benefiber for a period of time and stopped it. He is using Preparation H wipes.  He is currently taking Prilosec which is controlling his reflux symptoms. EGD done in November as outlined below. No evidence of Barrett's esophagus.   EGD 08/29/2017 - 3cm HH, mild esophagitis, 51mm inflammatory nodule - biopsies benign Colonoscopy 08/29/2017 - anal fissures, posterior anal canal, moderate internal hemorrhoids, diverticulosis  Colonoscopy 01/25/2012 - normal exam, biopsies ruled out microscopic colitis, told to repeat in 10 years EGD 01/25/2012 - mild esophagitis, no evidence of Barrett's   Past Medical History:  Diagnosis Date  . Allergy   . Anal fissure   . Cancer (HCC)    basal cell CA- forehead  . GERD (gastroesophageal reflux disease)   . Hemorrhoids   . Hernia, inguinal   . Hiatal hernia   . Hypertension   . IBS (irritable bowel syndrome)   . Ulcer      Past Surgical History:  Procedure Laterality Date  . FINGER SURGERY     Left Pinky finger  . INGUINAL HERNIA REPAIR     bilateral  . NASAL SEPTUM  SURGERY    . right knee surgery    . TONSILLECTOMY AND ADENOIDECTOMY     Family History  Problem Relation Age of Onset  . Heart disease Father   . Heart disease Brother   . Colon cancer Neg Hx   . Esophageal cancer Neg Hx   . Rectal cancer Neg Hx   . Stomach cancer Neg Hx    Social History   Tobacco Use  . Smoking status: Former Smoker    Last attempt to quit: 10/11/1980    Years since quitting: 37.5  . Smokeless tobacco: Never Used  Substance Use Topics  . Alcohol use: Yes    Comment: 2-3 drinks per MONTH  . Drug use: No   Current Outpatient Medications  Medication Sig Dispense Refill  . losartan-hydrochlorothiazide (HYZAAR) 100-12.5 MG tablet Take 1 tablet by mouth daily.  6  . omeprazole (PRILOSEC) 20 MG capsule Take 20 mg by mouth. OTC Mon, Wed, Frid    . Witch Hazel (PREPARATION H TOTABLES WIPES) 50 % PADS Apply topically daily as needed.     No current facility-administered medications for this visit.    No Known Allergies   Review of Systems: All systems reviewed and negative except where noted in HPI.    No results found.  Physical Exam: BP 138/70   Pulse 72   Ht 5\' 7"  (1.702 m)   Wt 194 lb 2 oz (88.1 kg)   BMI 30.40 kg/m  Constitutional: Pleasant,well-developed, male in no acute distress. HEENT: Normocephalic and atraumatic. Conjunctivae are normal. No scleral icterus. Neck supple.  Cardiovascular: Normal rate, regular rhythm.  Pulmonary/chest: Effort normal and breath sounds normal. No wheezing, rales or rhonchi. Abdominal: Soft, nondistended, nontender. There are no masses palpable. No hepatomegaly. Perianal exam - anal fissure left lateral area, internal hemorrhoids Extremities: no edema Lymphadenopathy: No cervical adenopathy noted. Neurological: Alert and oriented to person place and time. Skin: Skin is warm and dry. No rashes noted. Psychiatric: Normal mood and affect. Behavior is normal.   ASSESSMENT AND PLAN: 67 year old male here for  reassessment of the following issues:  Recurrent anal fissure / internal hemorrhoids - I think his recent symptoms are most likely due to recurrence of anal fissure. He responds well to nitroglycerin ointment however this has recurred a few times now. I offered him a surgical consultation which he declined, he wishes to avoid surgery if at all possible. Recommend we use nitroglycerin 0.125% ointment 3 times daily for the next 6-8 weeks. I counseled him this can take a few months to heal up. I'll give him refills for this to keep at home moving forward, he should use this liberally if he has any recurrence of symptoms in the interim. If he uses this and his symptoms fail to improve I asked him to call back in a few weeks. He denies any straining or passing hard stools at this time, however recommend he use a daily fiber supplement if needed to make sure this is the case. He will let me know if he fails to improve on this regimen.  Boody Cellar, MD [facility]

## 2018-04-11 NOTE — Patient Instructions (Addendum)
If you are age 67 or older, your body mass index should be between 23-30. Your Body mass index is 30.4 kg/m. If this is out of the aforementioned range listed, please consider follow up with your Primary Care Provider.  If you are age 53 or younger, your body mass index should be between 19-25. Your Body mass index is 30.4 kg/m. If this is out of the aformentioned range listed, please consider follow up with your Primary Care Provider.   We have sent a prescription for nitroglycerin 0.125% gel to [facility]. You should apply a pea size amount to your rectum three times daily x 6-8 weeks.  [facility]'s information is below: Address: [address] [place], [place] [address]  [phone]  *Please DO NOT go directly from our office to pick up this medication! Give the pharmacy 1 day to process the prescription as this is compounded at takes time to make.  Please start taking a daily fiber supplement.  We are giving you a handout today.   Anal Fissure, Adult An anal fissure is a small tear or crack in the skin around the opening of the butt (anus).Bleeding from the tear or crack usually stops on its own within a few minutes. The bleeding may happen every time you poop (have a bowel movement) until the tear or crack heals. Follow these instructions at home: Eating and drinking  Avoid bananas and dairy products. These foods can make it hard to poop.  Drink enough fluid to keep your pee (urine) clear or pale yellow.  Eat a lot of fruit, whole grains, and vegetables. General instructions  Keep the butt area as clean and dry as you can.  Take a warm water bath (sitz bath) as told by your doctor. Do not use soap.  Take over-the-counter and prescription medicines only as told by your doctor.  Use creams or ointments only as told by your doctor.  Keep all follow-up visits as told by your doctor. This is important. Contact a doctor if:  You have more  bleeding.  You have a fever.  You have watery poop (diarrhea) that is mixed with blood.  You have pain.  You problem gets worse, not better. This information is not intended to replace advice given to you by your health care provider. Make sure you discuss any questions you have with your health care provider. Document Released: 05/26/2011 Document Revised: 03/04/2016 Document Reviewed: 12/23/2014 Elsevier Interactive Patient Education  [employer].    Please call us in a few weeks if your symptoms have not improved.   Thank you for entrusting me with your care and for choosing [facility], Dr. [place] Cellar

## 2018-05-16 ENCOUNTER — Ambulatory Visit: Payer: 59 | Admitting: Gastroenterology

## 2018-12-26 ENCOUNTER — Other Ambulatory Visit: Payer: Self-pay | Admitting: Cardiology

## 2018-12-26 DIAGNOSIS — E785 Hyperlipidemia, unspecified: Secondary | ICD-10-CM | POA: Diagnosis not present

## 2018-12-26 DIAGNOSIS — I10 Essential (primary) hypertension: Secondary | ICD-10-CM | POA: Diagnosis not present

## 2018-12-27 LAB — COMPREHENSIVE METABOLIC PANEL
ALBUMIN: 4.3 g/dL (ref 3.8–4.8)
ALT: 19 IU/L (ref 0–44)
AST: 18 IU/L (ref 0–40)
Albumin/Globulin Ratio: 1.9 (ref 1.2–2.2)
Alkaline Phosphatase: 102 IU/L (ref 39–117)
BUN / CREAT RATIO: 18 (ref 10–24)
BUN: 22 mg/dL (ref 8–27)
Bilirubin Total: 0.7 mg/dL (ref 0.0–1.2)
CALCIUM: 9 mg/dL (ref 8.6–10.2)
CO2: 25 mmol/L (ref 20–29)
CREATININE: 1.23 mg/dL (ref 0.76–1.27)
Chloride: 105 mmol/L (ref 96–106)
GFR, EST AFRICAN AMERICAN: 69 mL/min/{1.73_m2} (ref >59)
GFR, EST NON AFRICAN AMERICAN: 60 mL/min/{1.73_m2} (ref >59)
GLUCOSE: 96 mg/dL (ref 65–99)
Globulin, Total: 2.3 g/dL (ref 1.5–4.5)
Potassium: 5 mmol/L (ref 3.5–5.2)
Sodium: 146 mmol/L — ABNORMAL HIGH (ref 134–144)
TOTAL PROTEIN: 6.6 g/dL (ref 6.0–8.5)

## 2018-12-27 LAB — LIPID PANEL W/O CHOL/HDL RATIO
Cholesterol, Total: 133 mg/dL (ref 100–199)
HDL: 41 mg/dL (ref >39)
LDL CALC: 79 mg/dL (ref 0–99)
Triglycerides: 67 mg/dL (ref 0–149)
VLDL CHOLESTEROL CAL: 13 mg/dL (ref 5–40)

## 2019-01-05 ENCOUNTER — Ambulatory Visit: Payer: Self-pay | Admitting: Cardiology

## 2019-01-21 ENCOUNTER — Other Ambulatory Visit: Payer: Self-pay | Admitting: Cardiology

## 2019-01-21 DIAGNOSIS — E785 Hyperlipidemia, unspecified: Secondary | ICD-10-CM

## 2019-01-21 DIAGNOSIS — I10 Essential (primary) hypertension: Secondary | ICD-10-CM

## 2019-02-02 ENCOUNTER — Ambulatory Visit: Payer: Self-pay | Admitting: Cardiology

## 2019-02-04 DIAGNOSIS — I714 Abdominal aortic aneurysm, without rupture, unspecified: Secondary | ICD-10-CM | POA: Insufficient documentation

## 2019-02-04 DIAGNOSIS — Z8249 Family history of ischemic heart disease and other diseases of the circulatory system: Secondary | ICD-10-CM | POA: Insufficient documentation

## 2019-02-04 NOTE — Progress Notes (Signed)
Virtual Visit via Video Note: This visit type was conducted due to national recommendations for restrictions regarding the COVID-19 Pandemic (e.g. social distancing).  This format is felt to be most appropriate for this patient at this time.  All issues noted in this document were discussed and addressed.  No physical exam was performed (except for noted visual exam findings with Telehealth visits).  The patient has consented to conduct a Telehealth visit and understands insurance will be billed.   I connected with@, on 02/05/19 at  by a video enabled telemedicine application and verified that I am speaking with the correct person using two identifiers.   I discussed the limitations of evaluation and management by telemedicine and the availability of in person appointments. The patient expressed understanding and agreed to proceed.   I have discussed with patient regarding the safety during COVID Pandemic and steps and precautions to be taken including social distancing, frequent hand wash and use of detergent soap, gels with the patient. I asked the patient to avoid touching mouth, nose, eyes, ears with the hands. I encouraged regular walking around the neighborhood and exercise and regular diet, as long as social distancing can be maintained.  Primary Physician/Referring:  Jani Gravel, MD  Patient ID: John Bird, male    DOB: June 25, 1951, 68 y.o.   MRN: [id number]  Chief Complaint  Patient presents with  . Chest Pain  . Hyperlipidemia    HPI: DARL KUSS  is a 68 y.o. male  with hypertension, prior tobacco use disorder with approximately 10-15-pack-year history, quit remotely, mild hyperlipidemia, small 3 cm AAA, family history of premature coronary artery disease in his brother having MI at age 76Y, dyspnea on exertion associated with chest discomfort who was seen by me about 3 months ago with nuclear stress test on 09/17/2018 revealing mild inferior ischemia. He now presents for  follow-up, also had lipid profile testing.   He is tolerating all his medications well and has not had recurrence of chest pain. He has been walking outside and working in the yard without discomfort and watching his diet lost 4-5 Lbs in past 3 months. Feels well.    Past Medical History:  Diagnosis Date  . Allergy   . Anal fissure   . Cancer (HCC)    basal cell CA- forehead  . GERD (gastroesophageal reflux disease)   . Hemorrhoids   . Hernia, inguinal   . Hiatal hernia   . Hypertension   . IBS (irritable bowel syndrome)   . Ulcer     Past Surgical History:  Procedure Laterality Date  . FINGER SURGERY     Left Pinky finger  . INGUINAL HERNIA REPAIR     bilateral  . NASAL SEPTUM SURGERY    . right knee surgery    . TONSILLECTOMY AND ADENOIDECTOMY      Social History   Socioeconomic History  . Marital status: Married    Spouse name: Not on file  . Number of children: 2  . Years of education: Not on file  . Highest education level: Not on file  Occupational History  . Occupation: retired  Scientific laboratory technician  . Financial resource strain: Not on file  . Food insecurity:    Worry: Not on file    Inability: Not on file  . Transportation needs:    Medical: Not on file    Non-medical: Not on file  Tobacco Use  . Smoking status: Former Smoker    Last attempt to  quit: 10/11/1980    Years since quitting: 38.3  . Smokeless tobacco: Never Used  Substance and Sexual Activity  . Alcohol use: Yes    Comment: 2-3 drinks per MONTH  . Drug use: No  . Sexual activity: Not on file  Lifestyle  . Physical activity:    Days per week: Not on file    Minutes per session: Not on file  . Stress: Not on file  Relationships  . Social connections:    Talks on phone: Not on file    Gets together: Not on file    Attends religious service: Not on file    Active member of club or organization: Not on file    Attends meetings of clubs or organizations: Not on file    Relationship status: Not  on file  . Intimate partner violence:    Fear of current or ex partner: Not on file    Emotionally abused: Not on file    Physically abused: Not on file    Forced sexual activity: Not on file  Other Topics Concern  . Not on file  Social History Narrative  . Not on file    Current Outpatient Medications on File Prior to Visit  Medication Sig Dispense Refill  . aspirin EC 81 MG tablet Take 81 mg by mouth daily.    . cetirizine (ZYRTEC) 10 MG tablet Take 10 mg by mouth daily.    Marland Kitchen omeprazole (PRILOSEC) 20 MG capsule Take 20 mg by mouth. OTC Mon, Wed, Frid    . rosuvastatin (CRESTOR) 10 MG tablet TAKE 1 TABLET BY MOUTH EVERY DAY 90 tablet 1  . valsartan-hydrochlorothiazide (DIOVAN-HCT) 320-12.5 MG tablet TAKE 1 TABLET BY MOUTH EVERY DAY IN THE MORNING 90 tablet 1  . Witch Hazel (PREPARATION H TOTABLES WIPES) 50 % PADS Apply topically daily as needed.     No current facility-administered medications on file prior to visit.     Review of Systems  Constitution: Negative for chills, decreased appetite, malaise/fatigue and weight gain.  Cardiovascular: Positive for dyspnea on exertion. Negative for leg swelling and syncope.  Endocrine: Negative for cold intolerance.  Hematologic/Lymphatic: Does not bruise/bleed easily.  Musculoskeletal: Negative for joint swelling.  Gastrointestinal: Negative for abdominal pain, anorexia and change in bowel habit.  Neurological: Negative for headaches and light-headedness.  Psychiatric/Behavioral: Negative for depression and substance abuse.  All other systems reviewed and are negative.     Objective  Height 5' 7"  (1.702 m), weight 187 lb (84.8 kg). Body mass index is 29.29 kg/m.   Physical exam not performed or limited due to virtual visit. Please see exam details from prior visit is as below.  He appeared well and in no acute distress.  Respiration was nonlabored.  Physical Exam  Constitutional: He appears well-developed. No distress.  Mildly  obese  HENT:  Head: Atraumatic.  Eyes: Conjunctivae are normal.  Neck: Neck supple. No JVD present. No thyromegaly present.  Cardiovascular: Normal rate, regular rhythm, normal heart sounds and intact distal pulses. Exam reveals no gallop.  No murmur heard. Pulmonary/Chest: Effort normal and breath sounds normal.  Abdominal: Soft. Bowel sounds are normal.  Musculoskeletal: Normal range of motion.        General: No edema.  Neurological: He is alert.  Skin: Skin is warm and dry.  Psychiatric: He has a normal mood and affect.   Radiology: No results found.  Laboratory examination:    CMP Latest Ref Rng & Units 12/26/2018  Glucose 65 -  99 mg/dL 96  BUN 8 - 27 mg/dL 22  Creatinine 0.76 - 1.27 mg/dL 1.23  Sodium 134 - 144 mmol/L 146(H)  Potassium 3.5 - 5.2 mmol/L 5.0  Chloride 96 - 106 mmol/L 105  CO2 20 - 29 mmol/L 25  Calcium 8.6 - 10.2 mg/dL 9.0  Total Protein 6.0 - 8.5 g/dL 6.6  Total Bilirubin 0.0 - 1.2 mg/dL 0.7  Alkaline Phos 39 - 117 IU/L 102  AST 0 - 40 IU/L 18  ALT 0 - 44 IU/L 19   No flowsheet data found. Lipid Panel     Component Value Date/Time   CHOL 133 12/26/2018 0958   TRIG 67 12/26/2018 0958   HDL 41 12/26/2018 0958   LDLCALC 79 12/26/2018 0958   09/26/2018: Cholesterol 156, triglycerides 67, HDL 44, LDL 99.  Creatinine 1.2, EGFR 62/72, potassium 4.4, CMP normal.  Labs 07/24/2018: Potassium 4.3, BUN 17, creatinine 1.2.  EGFR 60/69 mL, CMP otherwise normal.  HB 13.6/HCT 40.2, platelets 227.  Total cholesterol 181, triglycerides 81, HDL 44, LDL 120.  HEMOGLOBIN A1C No results found for: HGBA1C, MPG TSH No results for input(s): TSH in the last 8760 hours.  Cardiac Studies:   Echocardiogram 09/20/2018: 1. Left ventricle cavity is normal in size. Mild concentric hypertrophy of the left ventricle. Normal global wall motion. Doppler evidence of grade II (pseudonormal) diastolic dysfunction, elevated LAP. Calculated EF 66%. 2. Left atrial cavity is mildly  dilated. 3. Trace aortic regurgitation. 4. Mild (Grade I) mitral regurgitation. 5. Trace tricuspid regurgitation.  Lexiscan Sestamibi stress test 09/15/2018: 1. Lexiscan stress test was performed. Exercise capacity was not assessed. Stress symptoms included chest pressure, dyspnea, syncope. Resting blood pressure 138/82 mmHg, dropped to peak effect blood pressure of 82/40 mmHg. The resting and stress electrocardiogram demonstrated normal sinus rhythm, normal resting conduction, no resting arrhythmias and normal rest repolarization. Stress EKG is non diagnostic for ischemia as it is a pharmacologic stress. 2. The overall quality of the study is good. Left ventricular cavity is noted to be normal on the rest and stress studies. Gated SPECT images reveal normal myocardial thickening and wall motion. The left ventricular ejection fraction was calculated or visually estimated to be 68%. SPECT images reveal small size, mild intensity, reversible perfusion defect in basal inferior myocardium suggestive of ischemia. 3. Low risk study.  Abdominal aortic duplex 09/19/2018: Moderate dilatation of the abdominal aorta is noted in the distal aorta. Mild heterogeneous plaque noted. Normal flow velocities noted. A distal abdominal aortic aneurysm measuring 3.14 x 2.42 x 1.12 cm is seen. Recheck in 1 year.   Assessment   Angina pectoris (HCC)  Dyspnea on exertion  AAA (abdominal aortic aneurysm) without rupture (HCC) - Plan: PCV AORTA DUPLEX  Family history of premature CAD: brother with MI at age 14 Y  Essential hypertension  Hypercholesteremia  EKG 08/17/19/19: Normal sinus rhythm at rate of 57 bpm, normal axis, nonspecific anterolateral T-wave flattening. Low-voltage complexes.  Recommendations:   Patient with chronic dyspnea and chest pain suggestive of angina pectoris Now on aggressive medical therapy, symptoms of dyspnea has remained stable.  He has not had any significant chest discomfort, for  now continue statin therapy which he is tolerating, Lipids are significantly improved.  He has not checked his blood pressure, previously Blood pressure is also well controlled.  He prefers to come in to recheck his blood pressure also for clinic visit in July 2020, I'll set it up.  Overall, dyspnea may also be related to underlying lung  disease with regard to prior history of smoking which he quit 20-30 years ago.  He is remained abstinent.    Overall he remained stable.  He has AAA duplex Showing small AAA, Surprisingly CTA of the pelvis performed in 2018 had not revealed any aneurysm.  I'll recheck AAA duplex in one year.   Adrian Prows, MD, [facility] 02/05/2019, 10:34 AM [facility]. [place] Pager: [phone] Office: [phone] If no answer Cell [phone]

## 2019-02-05 ENCOUNTER — Encounter: Payer: Self-pay | Admitting: Cardiology

## 2019-02-05 ENCOUNTER — Ambulatory Visit (INDEPENDENT_AMBULATORY_CARE_PROVIDER_SITE_OTHER): Payer: Medicare Other | Admitting: Cardiology

## 2019-02-05 ENCOUNTER — Other Ambulatory Visit: Payer: Self-pay

## 2019-02-05 VITALS — Ht 67.0 in | Wt 187.0 lb

## 2019-02-05 DIAGNOSIS — E7800 Pure hypercholesterolemia, unspecified: Secondary | ICD-10-CM | POA: Diagnosis not present

## 2019-02-05 DIAGNOSIS — I10 Essential (primary) hypertension: Secondary | ICD-10-CM | POA: Diagnosis not present

## 2019-02-05 DIAGNOSIS — I209 Angina pectoris, unspecified: Secondary | ICD-10-CM | POA: Diagnosis not present

## 2019-02-05 DIAGNOSIS — Z8249 Family history of ischemic heart disease and other diseases of the circulatory system: Secondary | ICD-10-CM

## 2019-02-05 DIAGNOSIS — I714 Abdominal aortic aneurysm, without rupture, unspecified: Secondary | ICD-10-CM

## 2019-02-05 DIAGNOSIS — R0609 Other forms of dyspnea: Secondary | ICD-10-CM

## 2019-04-11 DIAGNOSIS — L570 Actinic keratosis: Secondary | ICD-10-CM | POA: Diagnosis not present

## 2019-04-11 DIAGNOSIS — D1724 Benign lipomatous neoplasm of skin and subcutaneous tissue of left leg: Secondary | ICD-10-CM | POA: Diagnosis not present

## 2019-04-11 DIAGNOSIS — Z86018 Personal history of other benign neoplasm: Secondary | ICD-10-CM | POA: Diagnosis not present

## 2019-04-11 DIAGNOSIS — Z85828 Personal history of other malignant neoplasm of skin: Secondary | ICD-10-CM | POA: Diagnosis not present

## 2019-04-11 DIAGNOSIS — D225 Melanocytic nevi of trunk: Secondary | ICD-10-CM | POA: Diagnosis not present

## 2019-04-11 DIAGNOSIS — D485 Neoplasm of uncertain behavior of skin: Secondary | ICD-10-CM | POA: Diagnosis not present

## 2019-04-11 DIAGNOSIS — D2272 Melanocytic nevi of left lower limb, including hip: Secondary | ICD-10-CM | POA: Diagnosis not present

## 2019-04-11 DIAGNOSIS — C44519 Basal cell carcinoma of skin of other part of trunk: Secondary | ICD-10-CM | POA: Diagnosis not present

## 2019-05-04 ENCOUNTER — Other Ambulatory Visit: Payer: Self-pay

## 2019-05-04 ENCOUNTER — Encounter: Payer: Self-pay | Admitting: Cardiology

## 2019-05-04 ENCOUNTER — Ambulatory Visit (INDEPENDENT_AMBULATORY_CARE_PROVIDER_SITE_OTHER): Payer: Medicare Other | Admitting: Cardiology

## 2019-05-04 VITALS — BP 160/75 | HR 52 | Ht 67.0 in | Wt 190.9 lb

## 2019-05-04 DIAGNOSIS — I209 Angina pectoris, unspecified: Secondary | ICD-10-CM | POA: Diagnosis not present

## 2019-05-04 DIAGNOSIS — E7800 Pure hypercholesterolemia, unspecified: Secondary | ICD-10-CM | POA: Diagnosis not present

## 2019-05-04 DIAGNOSIS — I714 Abdominal aortic aneurysm, without rupture, unspecified: Secondary | ICD-10-CM

## 2019-05-04 DIAGNOSIS — I10 Essential (primary) hypertension: Secondary | ICD-10-CM

## 2019-05-04 MED ORDER — AMLODIPINE BESYLATE 5 MG PO TABS: 5.0000 mg | ORAL_TABLET | Freq: Every day | ORAL | 2 refills | Status: DC

## 2019-05-04 NOTE — Progress Notes (Signed)
Primary Physician/Referring:  Jani Gravel, MD  Patient ID: John Bird, male    DOB: 1951-07-19, 68 y.o.   MRN: [id number]  Chief Complaint  Patient presents with  . Hypertension  . AAA  . Follow-up    39mo   HPI: John Bird is a 68y.o. male  with hypertension, prior tobacco use disorder with approximately 10-15-pack-year history, quit remotely, mild hyperlipidemia, small 3 cm AAA, family history of premature coronary artery disease in his brother having MI at age 3026Y dyspnea on exertion associated with chest discomfort, nuclear stress test on 09/17/2018 revealing mild inferior ischemia.  He is now here on a 373-monthffice visit and f/u of possible CAD and hypertension.  He is tolerating all his medications well and has not had recurrence of chest pain.  Feels well. Essentially asymptomatic except he has noticed elevated BP in 17546Fystolic.    Past Medical History:  Diagnosis Date  . Allergy   . Anal fissure   . Cancer (HCC)    basal cell CA- forehead  . Hemorrhoids   . Hernia, inguinal   . Hiatal hernia   . IBS (irritable bowel syndrome)   . Ulcer     Past Surgical History:  Procedure Laterality Date  . FINGER SURGERY     Left Pinky finger  . INGUINAL HERNIA REPAIR     bilateral  . NASAL SEPTUM SURGERY    . right knee surgery    . TONSILLECTOMY AND ADENOIDECTOMY      Social History   Socioeconomic History  . Marital status: Married    Spouse name: Not on file  . Number of children: 2  . Years of education: Not on file  . Highest education level: Not on file  Occupational History  . Occupation: retired  [employer] laboratory technician. Financial resource strain: Not on file  . Food insecurity    Worry: Not on file    Inability: Not on file  . Transportation needs    Medical: Not on file    Non-medical: Not on file  Tobacco Use  . Smoking status: Former Smoker    Packs/day: 1.50    Years: 15.00    Pack years: 22.50    Quit date: 10/11/1980    Years since  quitting: 38.5  . Smokeless tobacco: Never Used  Substance and Sexual Activity  . Alcohol use: Yes    Comment: 2-3 drinks per MONTH  . Drug use: No  . Sexual activity: Not on file  Lifestyle  . Physical activity    Days per week: Not on file    Minutes per session: Not on file  . Stress: Not on file  Relationships  . Social coHerbalistn phone: Not on file    Gets together: Not on file    Attends religious service: Not on file    Active member of club or organization: Not on file    Attends meetings of clubs or organizations: Not on file    Relationship status: Not on file  . Intimate partner violence    Fear of current or ex partner: Not on file    Emotionally abused: Not on file    Physically abused: Not on file    Forced sexual activity: Not on file  Other Topics Concern  . Not on file  Social History Narrative  . Not on file    Current Outpatient Medications on File Prior to Visit  Medication Sig Dispense Refill  . aspirin EC 81 MG tablet Take 81 mg by mouth daily.    . cetirizine (ZYRTEC) 10 MG tablet Take 10 mg by mouth as needed.     Marland Kitchen omeprazole (PRILOSEC) 20 MG capsule Take 20 mg by mouth. OTC Mon, Wed, Frid    . rosuvastatin (CRESTOR) 10 MG tablet TAKE 1 TABLET BY MOUTH EVERY DAY 90 tablet 1  . valsartan-hydrochlorothiazide (DIOVAN-HCT) 320-12.5 MG tablet TAKE 1 TABLET BY MOUTH EVERY DAY IN THE MORNING 90 tablet 1  . Witch Hazel (PREPARATION H TOTABLES WIPES) 50 % PADS Apply topically daily as needed.     No current facility-administered medications on file prior to visit.    Review of Systems  Constitution: Negative for chills, decreased appetite, malaise/fatigue and weight gain.  Cardiovascular: Negative for dyspnea on exertion, leg swelling and syncope.  Endocrine: Negative for cold intolerance.  Hematologic/Lymphatic: Does not bruise/bleed easily.  Musculoskeletal: Negative for joint swelling.  Gastrointestinal: Negative for abdominal pain,  anorexia, change in bowel habit, hematochezia and melena.  Neurological: Negative for headaches and light-headedness.  Psychiatric/Behavioral: Negative for depression and substance abuse.  All other systems reviewed and are negative.   Blood pressure (!) 160/75, pulse (!) 52, height _0  (1.702 m), weight 190 lb 14.4 oz (86.6 kg), SpO2 98 %. Body mass index is 29.9 kg/m.    Physical Exam  Constitutional: He appears well-developed. No distress.  Mildly obese  HENT:  Head: Atraumatic.  Eyes: Conjunctivae are normal.  Neck: Neck supple. No JVD present. No thyromegaly present.  Cardiovascular: Regular rhythm, normal heart sounds, intact distal pulses and normal pulses. Bradycardia present. Exam reveals no gallop.  No murmur heard. Pulmonary/Chest: Effort normal and breath sounds normal.  Abdominal: Soft. Bowel sounds are normal.  Musculoskeletal: Normal range of motion.        General: No edema.  Neurological: He is alert.  Skin: Skin is warm and dry.  Psychiatric: He has a normal mood and affect.   Radiology: No results found.  Laboratory examination:    CMP Latest Ref Rng & Units 12/26/2018  Glucose 65 - 99 mg/dL 96  BUN 8 - 27 mg/dL 22  Creatinine 0.76 - 1.27 mg/dL 1.23  Sodium 134 - 144 mmol/L 146(H)  Potassium 3.5 - 5.2 mmol/L 5.0  Chloride 96 - 106 mmol/L 105  CO2 20 - 29 mmol/L 25  Calcium 8.6 - 10.2 mg/dL 9.0  Total Protein 6.0 - 8.5 g/dL 6.6  Total Bilirubin 0.0 - 1.2 mg/dL 0.7  Alkaline Phos 39 - 117 IU/L 102  AST 0 - 40 IU/L 18  ALT 0 - 44 IU/L 19   No flowsheet data found. Lipid Panel     Component Value Date/Time   CHOL 133 12/26/2018 0958   TRIG 67 12/26/2018 0958   HDL 41 12/26/2018 0958   LDLCALC 79 12/26/2018 0958   09/26/2018: Cholesterol 156, triglycerides 67, HDL 44, LDL 99.  Creatinine 1.2, EGFR 62/72, potassium 4.4, CMP normal.  Labs 07/24/2018: Potassium 4.3, BUN 17, creatinine 1.2.  EGFR 60/69 mL, CMP otherwise normal.  HB 13.6/HCT 40.2,  platelets 227.  Total cholesterol 181, triglycerides 81, HDL 44, LDL 120.  HEMOGLOBIN A1C No results found for: HGBA1C, MPG TSH No results for input(s): TSH in the last 8760 hours.  Cardiac Studies:   Echocardiogram 09/20/2018: 1. Left ventricle cavity is normal in size. Mild concentric hypertrophy of the left ventricle. Normal global wall motion. Doppler evidence of grade II (pseudonormal)  diastolic dysfunction, elevated LAP. Calculated EF 66%. 2. Left atrial cavity is mildly dilated. 3. Trace aortic regurgitation. 4. Mild (Grade I) mitral regurgitation. 5. Trace tricuspid regurgitation.  Lexiscan Sestamibi stress test 09/15/2018: 1. Lexiscan stress test was performed. Exercise capacity was not assessed. Stress symptoms included chest pressure, dyspnea, syncope. Resting blood pressure 138/82 mmHg, dropped to peak effect blood pressure of 82/40 mmHg. The resting and stress electrocardiogram demonstrated normal sinus rhythm, normal resting conduction, no resting arrhythmias and normal rest repolarization. Stress EKG is non diagnostic for ischemia as it is a pharmacologic stress. 2. The overall quality of the study is good. Left ventricular cavity is noted to be normal on the rest and stress studies. Gated SPECT images reveal normal myocardial thickening and wall motion. The left ventricular ejection fraction was calculated or visually estimated to be 68%. SPECT images reveal small size, mild intensity, reversible perfusion defect in basal inferior myocardium suggestive of ischemia. 3. Low risk study.  Abdominal aortic duplex 09/19/2018: Moderate dilatation of the abdominal aorta is noted in the distal aorta. Mild heterogeneous plaque noted. Normal flow velocities noted. A distal abdominal aortic aneurysm measuring 3.14 x 2.42 x 1.12 cm is seen. Recheck in 1 year.   Assessment     ICD-10-CM   1. Essential hypertension  I10 EKG 12-Lead    amLODipine (NORVASC) 5 MG tablet  2. Angina pectoris  (HCC)  I20.9   3. Hypercholesteremia  E78.00   4. AAA (abdominal aortic aneurysm) without rupture ([place])  I71.4     EKG 05/04/2019: Marked sinus bradycardia at rate of 49 bpm, normal axis, no evidence of ischemia, otherwise normal EKG. compared to 08/16/2018, nonspecific anterolateral T wave flattening no longer present.  No significant change in heart rate, previously 57 bpm.   Recommendations:   Patient with chronic dyspnea and chest pain suggestive of angina pectoris Now on aggressive medical therapy, symptoms of dyspnea has remained stable.  He has not had any significant chest discomfort, for now continue statin therapy which he is tolerating, Lipids are significantly improved.   Blood pressure remains elevated, I have added amlodipine 5 mg daily.  I have discussed the side effects of leg edema with amlodipine.  His blood pressure is improved he could increase it to 10 mg. I'll see him back in 6 weeks for follow-up of hypertension.  He does have a very small abdominal aortic aneurysm, he has been scheduled for abdominal aortic duplex December 2020, afebrile and was stable, he'll need repeat follow-up in 3-4 years or unless clinically indicated.  Adrian Prows, MD, [facility] 05/07/2019, 9:44 AM [facility]. [place] Pager: [phone] Office: [phone] If no answer Cell [phone]

## 2019-06-14 DIAGNOSIS — C44519 Basal cell carcinoma of skin of other part of trunk: Secondary | ICD-10-CM | POA: Diagnosis not present

## 2019-06-15 ENCOUNTER — Other Ambulatory Visit: Payer: Self-pay

## 2019-06-15 DIAGNOSIS — Z20822 Contact with and (suspected) exposure to covid-19: Secondary | ICD-10-CM

## 2019-06-17 LAB — NOVEL CORONAVIRUS, NAA: SARS-CoV-2, NAA: NOT DETECTED

## 2019-06-25 ENCOUNTER — Encounter: Payer: Self-pay | Admitting: Cardiology

## 2019-06-25 ENCOUNTER — Other Ambulatory Visit: Payer: Self-pay

## 2019-06-25 ENCOUNTER — Telehealth (INDEPENDENT_AMBULATORY_CARE_PROVIDER_SITE_OTHER): Payer: Medicare Other | Admitting: Cardiology

## 2019-06-25 VITALS — BP 154/83 | HR 58 | Ht 66.0 in | Wt 187.0 lb

## 2019-06-25 DIAGNOSIS — I714 Abdominal aortic aneurysm, without rupture, unspecified: Secondary | ICD-10-CM

## 2019-06-25 DIAGNOSIS — I10 Essential (primary) hypertension: Secondary | ICD-10-CM

## 2019-06-25 DIAGNOSIS — K602 Anal fissure, unspecified: Secondary | ICD-10-CM | POA: Diagnosis not present

## 2019-06-25 DIAGNOSIS — I209 Angina pectoris, unspecified: Secondary | ICD-10-CM

## 2019-06-25 MED ORDER — ATENOLOL 50 MG PO TABS: 50.0000 mg | ORAL_TABLET | Freq: Every evening | ORAL | 2 refills | Status: DC

## 2019-06-25 MED ORDER — AMLODIPINE BESYLATE 10 MG PO TABS: 10.0000 mg | ORAL_TABLET | Freq: Every day | ORAL | 3 refills | Status: DC

## 2019-06-25 NOTE — Progress Notes (Signed)
Virtual Visit via Telephone Note: Patient unable to use video assisted device.  This visit type was conducted due to national recommendations for restrictions regarding the COVID-19 Pandemic (e.g. social distancing).  This format is felt to be most appropriate for this patient at this time.  All issues noted in this document were discussed and addressed.  No physical exam was performed.  The patient has consented to conduct a Telehealth visit and understands insurance will be billed.   I connected with@, on 06/25/19 at  by TELEPHONE and verified that I am speaking with the correct person using two identifiers.   I discussed the limitations of evaluation and management by telemedicine and the availability of in person appointments. The patient expressed understanding and agreed to proceed.   I have discussed with patient regarding the safety during COVID Pandemic and steps and precautions to be taken including social distancing, frequent hand wash and use of detergent soap, gels with the patient. I asked the patient to avoid touching mouth, nose, eyes, ears with the hands. I encouraged regular walking around the neighborhood and exercise and regular diet, as long as social distancing can be maintained.  Primary Physician/Referring:  Jani Gravel, MD  Patient ID: John Bird, male    DOB: 1950/12/15, 68 y.o.   MRN: [id number]  Chief Complaint  Patient presents with  . Hypertension  . Follow-up    HPI: John Bird  is a 68 y.o. male  with hypertension, prior tobacco use disorder with approximately 10-15-pack-year history, quit remotely, mild hyperlipidemia, small 3 cm AAA, family history of premature coronary artery disease in his brother having MI at age 33Y, dyspnea on exertion associated with chest discomfort, nuclear stress test on 09/17/2018 revealing mild inferior ischemia.  He is now here on a 33-monthoffice visit and f/u of possible CAD and hypertension.  He is tolerating all his  medications well and has not had recurrence of chest pain.  Feels well. Essentially asymptomatic except he has noticed elevated BP in 1097Dsystolic.    Past Medical History:  Diagnosis Date  . Allergy   . Anal fissure   . Cancer (HCC)    basal cell CA- forehead  . Hemorrhoids   . Hernia, inguinal   . Hiatal hernia   . IBS (irritable bowel syndrome)   . Ulcer     Past Surgical History:  Procedure Laterality Date  . FINGER SURGERY     Left Pinky finger  . INGUINAL HERNIA REPAIR     bilateral  . NASAL SEPTUM SURGERY    . right knee surgery    . TONSILLECTOMY AND ADENOIDECTOMY      Social History   Socioeconomic History  . Marital status: Married    Spouse name: Not on file  . Number of children: 2  . Years of education: Not on file  . Highest education level: Not on file  Occupational History  . Occupation: retired  SScientific laboratory technician . Financial resource strain: Not on file  . Food insecurity    Worry: Not on file    Inability: Not on file  . Transportation needs    Medical: Not on file    Non-medical: Not on file  Tobacco Use  . Smoking status: Former Smoker    Packs/day: 1.50    Years: 15.00    Pack years: 22.50    Quit date: 10/11/1980    Years since quitting: 38.7  . Smokeless tobacco: Never Used  Substance and  Sexual Activity  . Alcohol use: Yes    Comment: 2-3 drinks per MONTH  . Drug use: No  . Sexual activity: Not on file  Lifestyle  . Physical activity    Days per week: Not on file    Minutes per session: Not on file  . Stress: Not on file  Relationships  . Social Herbalist on phone: Not on file    Gets together: Not on file    Attends religious service: Not on file    Active member of club or organization: Not on file    Attends meetings of clubs or organizations: Not on file    Relationship status: Not on file  . Intimate partner violence    Fear of current or ex partner: Not on file    Emotionally abused: Not on file    Physically  abused: Not on file    Forced sexual activity: Not on file  Other Topics Concern  . Not on file  Social History Narrative  . Not on file    Current Outpatient Medications on File Prior to Visit  Medication Sig Dispense Refill  . aspirin EC 81 MG tablet Take 81 mg by mouth daily.    . cetirizine (ZYRTEC) 10 MG tablet Take 10 mg by mouth as needed.     Marland Kitchen omeprazole (PRILOSEC) 20 MG capsule Take 20 mg by mouth. OTC Mon, Wed, Frid    . rosuvastatin (CRESTOR) 10 MG tablet TAKE 1 TABLET BY MOUTH EVERY DAY 90 tablet 1  . valsartan-hydrochlorothiazide (DIOVAN-HCT) 320-12.5 MG tablet TAKE 1 TABLET BY MOUTH EVERY DAY IN THE MORNING 90 tablet 1  . Witch Hazel (PREPARATION H TOTABLES WIPES) 50 % PADS Apply topically daily as needed.     No current facility-administered medications on file prior to visit.    Review of Systems  Constitution: Negative for chills, decreased appetite, malaise/fatigue and weight gain.  Cardiovascular: Negative for dyspnea on exertion, leg swelling and syncope.  Endocrine: Negative for cold intolerance.  Hematologic/Lymphatic: Does not bruise/bleed easily.  Musculoskeletal: Negative for joint swelling.  Gastrointestinal: Positive for hemorrhoids (fissure and pain). Negative for abdominal pain, anorexia, change in bowel habit, hematochezia and melena.  Neurological: Negative for headaches and light-headedness.  Psychiatric/Behavioral: Negative for depression and substance abuse.  All other systems reviewed and are negative.   Blood pressure (!) 154/83, pulse (!) 58, height 5' 6"  (1.676 m), weight 187 lb (84.8 kg). Body mass index is 30.18 kg/m.   Physical exam not performed or limited due to virtual visit.  Please see exam details from prior visit is as below.  Physical Exam  Constitutional: He appears well-developed. No distress.  Mildly obese  HENT:  Head: Atraumatic.  Eyes: Conjunctivae are normal.  Neck: Neck supple. No JVD present. No thyromegaly present.   Cardiovascular: Regular rhythm, normal heart sounds, intact distal pulses and normal pulses. Bradycardia present. Exam reveals no gallop.  No murmur heard. Pulmonary/Chest: Effort normal and breath sounds normal.  Abdominal: Soft. Bowel sounds are normal.  Musculoskeletal: Normal range of motion.        General: No edema.  Neurological: He is alert.  Skin: Skin is warm and dry.  Psychiatric: He has a normal mood and affect.   Radiology: No results found.  Laboratory examination:    CMP Latest Ref Rng & Units 12/26/2018  Glucose 65 - 99 mg/dL 96  BUN 8 - 27 mg/dL 22  Creatinine 0.76 - 1.27 mg/dL 1.23  Sodium 134 - 144 mmol/L 146(H)  Potassium 3.5 - 5.2 mmol/L 5.0  Chloride 96 - 106 mmol/L 105  CO2 20 - 29 mmol/L 25  Calcium 8.6 - 10.2 mg/dL 9.0  Total Protein 6.0 - 8.5 g/dL 6.6  Total Bilirubin 0.0 - 1.2 mg/dL 0.7  Alkaline Phos 39 - 117 IU/L 102  AST 0 - 40 IU/L 18  ALT 0 - 44 IU/L 19   No flowsheet data found. Lipid Panel     Component Value Date/Time   CHOL 133 12/26/2018 0958   TRIG 67 12/26/2018 0958   HDL 41 12/26/2018 0958   LDLCALC 79 12/26/2018 0958   09/26/2018: Cholesterol 156, triglycerides 67, HDL 44, LDL 99.  Creatinine 1.2, EGFR 62/72, potassium 4.4, CMP normal.  Labs 07/24/2018: Potassium 4.3, BUN 17, creatinine 1.2.  EGFR 60/69 mL, CMP otherwise normal.  HB 13.6/HCT 40.2, platelets 227.  Total cholesterol 181, triglycerides 81, HDL 44, LDL 120.  HEMOGLOBIN A1C No results found for: HGBA1C, MPG TSH No results for input(s): TSH in the last 8760 hours.  Cardiac Studies:   Echocardiogram 09/20/2018: 1. Left ventricle cavity is normal in size. Mild concentric hypertrophy of the left ventricle. Normal global wall motion. Doppler evidence of grade II (pseudonormal) diastolic dysfunction, elevated LAP. Calculated EF 66%. 2. Left atrial cavity is mildly dilated. 3. Trace aortic regurgitation. 4. Mild (Grade I) mitral regurgitation. 5. Trace tricuspid  regurgitation.  Lexiscan Sestamibi stress test 09/15/2018: 1. Lexiscan stress test was performed. Exercise capacity was not assessed. Stress symptoms included chest pressure, dyspnea, syncope. Resting blood pressure 138/82 mmHg, dropped to peak effect blood pressure of 82/40 mmHg. The resting and stress electrocardiogram demonstrated normal sinus rhythm, normal resting conduction, no resting arrhythmias and normal rest repolarization. Stress EKG is non diagnostic for ischemia as it is a pharmacologic stress. 2. The overall quality of the study is good. Left ventricular cavity is noted to be normal on the rest and stress studies. Gated SPECT images reveal normal myocardial thickening and wall motion. The left ventricular ejection fraction was calculated or visually estimated to be 68%. SPECT images reveal small size, mild intensity, reversible perfusion defect in basal inferior myocardium suggestive of ischemia. 3. Low risk study.  Abdominal aortic duplex 09/19/2018: Moderate dilatation of the abdominal aorta is noted in the distal aorta. Mild heterogeneous plaque noted. Normal flow velocities noted. A distal abdominal aortic aneurysm measuring 3.14 x 2.42 x 1.12 cm is seen. Recheck in 1 year.   Assessment     ICD-10-CM   1. Essential hypertension  I10 amLODipine (NORVASC) 10 MG tablet    atenolol (TENORMIN) 50 MG tablet  2. Angina pectoris (HCC)  I20.9   3. AAA (abdominal aortic aneurysm) without rupture (HCC)  I71.4   4. Fissure in ano  K60.2 Ambulatory referral to Gastroenterology    EKG 05/04/2019: Marked sinus bradycardia at rate of 49 bpm, normal axis, no evidence of ischemia, otherwise normal EKG. compared to 08/16/2018, nonspecific anterolateral T wave flattening no longer present.  No significant change in heart rate, previously 57 bpm.   Recommendations:   Patient is here on a 6-week office visit and follow-up of hypertension, on his last office visit had added amlodipine 5 mg.  He is  tolerating this without any side effects, he has not had any leg edema.  However blood pressure continues to be uncontrolled, hence will increase it to 10 mg daily.  She has underlying sinus bradycardia hence beta-blocker is not been treated although he  has had history of angina pectoris.  I will add atenolol 50 mg daily.  I will see him back in the office in 10 to 11 weeks with repeat EKG and blood pressure follow-up.  He does have AAA and surveillance Dopplers have been set up.  He has also developed fissure in ano, will refer him to be evaluated by GI, he has seen Dr. Havery Moros in the past.    Adrian Prows, MD, [facility] 06/25/2019, 11:30 AM [facility]. [place] Pager: [phone] Office: [phone] If no answer Cell [phone]

## 2019-07-11 ENCOUNTER — Other Ambulatory Visit: Payer: Self-pay | Admitting: Cardiology

## 2019-07-11 DIAGNOSIS — E785 Hyperlipidemia, unspecified: Secondary | ICD-10-CM

## 2019-07-11 DIAGNOSIS — I10 Essential (primary) hypertension: Secondary | ICD-10-CM

## 2019-07-20 DIAGNOSIS — Z23 Encounter for immunization: Secondary | ICD-10-CM | POA: Diagnosis not present

## 2019-07-26 ENCOUNTER — Other Ambulatory Visit: Payer: Self-pay | Admitting: Cardiology

## 2019-07-26 DIAGNOSIS — I10 Essential (primary) hypertension: Secondary | ICD-10-CM

## 2019-08-07 ENCOUNTER — Ambulatory Visit (INDEPENDENT_AMBULATORY_CARE_PROVIDER_SITE_OTHER): Payer: Medicare Other | Admitting: Gastroenterology

## 2019-08-07 ENCOUNTER — Encounter: Payer: Self-pay | Admitting: Gastroenterology

## 2019-08-07 ENCOUNTER — Other Ambulatory Visit (INDEPENDENT_AMBULATORY_CARE_PROVIDER_SITE_OTHER): Payer: Medicare Other

## 2019-08-07 VITALS — BP 120/72 | HR 64 | Temp 97.6°F | Ht 67.0 in | Wt 191.6 lb

## 2019-08-07 DIAGNOSIS — R109 Unspecified abdominal pain: Secondary | ICD-10-CM | POA: Diagnosis not present

## 2019-08-07 DIAGNOSIS — R195 Other fecal abnormalities: Secondary | ICD-10-CM

## 2019-08-07 DIAGNOSIS — I209 Angina pectoris, unspecified: Secondary | ICD-10-CM

## 2019-08-07 DIAGNOSIS — K602 Anal fissure, unspecified: Secondary | ICD-10-CM | POA: Diagnosis not present

## 2019-08-07 LAB — CBC WITH DIFFERENTIAL/PLATELET
Basophils Absolute: 0.1 10*3/uL (ref 0.0–0.1)
Basophils Relative: 1 % (ref 0.0–3.0)
Eosinophils Absolute: 0.6 10*3/uL (ref 0.0–0.7)
Eosinophils Relative: 8 % — ABNORMAL HIGH (ref 0.0–5.0)
HCT: 41.8 % (ref 39.0–52.0)
Hemoglobin: 14 g/dL (ref 13.0–17.0)
Lymphocytes Relative: 27.8 % (ref 12.0–46.0)
Lymphs Abs: 2.2 10*3/uL (ref 0.7–4.0)
MCHC: 33.5 g/dL (ref 30.0–36.0)
MCV: 92.9 fl (ref 78.0–100.0)
Monocytes Absolute: 0.8 10*3/uL (ref 0.1–1.0)
Monocytes Relative: 10 % (ref 3.0–12.0)
Neutro Abs: 4.2 10*3/uL (ref 1.4–7.7)
Neutrophils Relative %: 53.2 % (ref 43.0–77.0)
Platelets: 239 10*3/uL (ref 150.0–400.0)
RBC: 4.5 Mil/uL (ref 4.22–5.81)
RDW: 13.1 % (ref 11.5–15.5)
WBC: 7.9 10*3/uL (ref 4.0–10.5)

## 2019-08-07 LAB — COMPREHENSIVE METABOLIC PANEL
ALT: 25 U/L (ref 0–53)
AST: 25 U/L (ref 0–37)
Albumin: 4.5 g/dL (ref 3.5–5.2)
Alkaline Phosphatase: 83 U/L (ref 39–117)
BUN: 22 mg/dL (ref 6–23)
CO2: 32 mEq/L (ref 19–32)
Calcium: 9.5 mg/dL (ref 8.4–10.5)
Chloride: 103 mEq/L (ref 96–112)
Creatinine, Ser: 1.23 mg/dL (ref 0.40–1.50)
GFR: 58.41 mL/min — ABNORMAL LOW (ref >60.00)
Glucose, Bld: 99 mg/dL (ref 70–99)
Potassium: 4.8 mEq/L (ref 3.5–5.1)
Sodium: 138 mEq/L (ref 135–145)
Total Bilirubin: 0.7 mg/dL (ref 0.2–1.2)
Total Protein: 7.3 g/dL (ref 6.0–8.3)

## 2019-08-07 MED ORDER — DICYCLOMINE HCL 10 MG PO CAPS: 10.0000 mg | ORAL_CAPSULE | Freq: Two times a day (BID) | ORAL | 5 refills | Status: DC

## 2019-08-07 MED ORDER — CITRUCEL PO POWD: 1.0000 | Freq: Every day | ORAL | Status: DC

## 2019-08-07 MED ORDER — AMBULATORY NON FORMULARY MEDICATION: 0 refills | Status: DC

## 2019-08-07 NOTE — Progress Notes (Signed)
HPI :  68 year old male here for follow-up visit.  He has been followed in the past for recurrent anal fissures and internal hemorrhoids.  He also has a history of reflux.  His main complaint recently is gas and bloating.  He states this is been bothering him more so for the past few months, although endorses its been going on for years.  Lately at night he has been feeling gas pains and abdominal cramps in his mid to lower abdomen, he is experience this on 7 occasions since October 13, he has been keeping track of his symptoms.  This can often bother him at night, sometimes in his sleep.  He states he has a bowel movement and his symptoms are relieved.  He denies any major changes to his diet.  He has had some adjustments to his hypertensive regimen in recent months.  He states his stool consistency has changed, it is typically looser or softer than previous, more like a paste.  He denies any blood in his stools.  He does see some blood on the toilet paper when wiping along with some discomfort at times, he wonders if he has recurrence of anal fissure.  He has used topical nitroglycerin in the past for this however has stated has called some dizziness and lightheadedness at times.  He has had a relatively recent colonoscopy in 2018 which did not show any polyps.  He does have diverticulosis.  He had a CT scan in 2014 as well as 2017 for abdominal pain, neither of which have shown any significant pathology.  He has been using his omeprazole 20 mg about 3 times a week and states this generally keeps his reflux and digestive issues at [place].  He uses Tums a few times a week for some breakthrough which is mild at times.  No dysphagia.  He has no history of Barrett's esophagus based on prior endoscopy as below.  Prior evaluation: EGD 08/29/2017 - 3cm HH, mild esophagitis, 14m inflammatory nodule - biopsies benign Colonoscopy 08/29/2017 - anal fissures, posterior anal canal, moderate internal hemorrhoids,  diverticulosis  Colonoscopy 01/25/2012 - normal exam, biopsies ruled out microscopic colitis, told to repeat in 10 years EGD 01/25/2012 - mild esophagitis, no evidence of Barrett's  CT scan 04/05/2016 - IMPRESSION: 1. Mild sigmoid diverticulosis. Negative for diverticulitis. No bowel mass or obstruction 2. Prostate enlargement 3. Atherosclerotic disease 4. Liver cyst slightly larger but appears benign  CT scan 03/30/2013 - normal   Past Medical History:  Diagnosis Date  . Allergy   . Anal fissure   . Cancer (HCC)    basal cell CA- forehead  . Hemorrhoids   . Hernia, inguinal   . Hiatal hernia   . IBS (irritable bowel syndrome)   . Ulcer      Past Surgical History:  Procedure Laterality Date  . FINGER SURGERY     Left Pinky finger  . INGUINAL HERNIA REPAIR     bilateral  . NASAL SEPTUM SURGERY    . right knee surgery    . TONSILLECTOMY AND ADENOIDECTOMY     Family History  Problem Relation Age of Onset  . Heart disease Father   . Heart disease Brother   . Colon cancer Neg Hx   . Esophageal cancer Neg Hx   . Rectal cancer Neg Hx   . Stomach cancer Neg Hx    Social History   Tobacco Use  . Smoking status: Former Smoker    Packs/day: 1.50  Years: 15.00    Pack years: 22.50    Quit date: 10/11/1980    Years since quitting: 38.8  . Smokeless tobacco: Never Used  Substance Use Topics  . Alcohol use: Yes    Comment: 2-3 drinks per MONTH  . Drug use: No   Current Outpatient Medications  Medication Sig Dispense Refill  . amLODipine (NORVASC) 10 MG tablet Take 1 tablet (10 mg total) by mouth daily. 90 tablet 3  . aspirin EC 81 MG tablet Take 81 mg by mouth daily.    Marland Kitchen atenolol (TENORMIN) 50 MG tablet Take 1 tablet (50 mg total) by mouth every evening. 30 tablet 2  . cetirizine (ZYRTEC) 10 MG tablet Take 10 mg by mouth as needed.     Marland Kitchen omeprazole (PRILOSEC) 20 MG capsule Take 20 mg by mouth. OTC Mon, Wed, Frid    . rosuvastatin (CRESTOR) 10 MG tablet TAKE 1  TABLET BY MOUTH EVERY DAY 90 tablet 1  . valsartan-hydrochlorothiazide (DIOVAN-HCT) 320-12.5 MG tablet TAKE 1 TABLET BY MOUTH EVERY DAY IN THE MORNING 90 tablet 1   No current facility-administered medications for this visit.    No Known Allergies   Review of Systems: All systems reviewed and negative except where noted in HPI.   No results found for: WBC, HGB, HCT, MCV, PLT  Lab Results  Component Value Date   CREATININE 1.23 12/26/2018   BUN 22 12/26/2018   NA 146 (H) 12/26/2018   K 5.0 12/26/2018   CL 105 12/26/2018   CO2 25 12/26/2018     Physical Exam: BP 120/72   Pulse 64   Temp 97.6 F (36.4 C)   Ht _0  (1.702 m)   Wt 191 lb 9.6 oz (86.9 kg)   BMI 30.01 kg/m  Constitutional: Pleasant,well-developed, male in no acute distress. HEENT: Normocephalic and atraumatic. Conjunctivae are normal. No scleral icterus. Neck supple.  Cardiovascular: Normal rate, regular rhythm.  Pulmonary/chest: Effort normal and breath sounds normal. No wheezing, rales or rhonchi. Abdominal: Soft, nondistended, nontender. There are no masses palpable. No hepatomegaly. DRE - standby Tia Alert CMA - small posterior midline anal canal fissure, no mass lesions, hemorrhoids also noted - some pain with exam, anoscopy not done Extremities: no edema Lymphadenopathy: No cervical adenopathy noted. Neurological: Alert and oriented to person place and time. Skin: Skin is warm and dry. No rashes noted. Psychiatric: Normal mood and affect. Behavior is normal.   ASSESSMENT AND PLAN: 68 year old male here for reassessment of the following issues:  Abdominal cramps / change in stool form / anal fissure - he has a relatively recent colonoscopy which looked okay and I reassured him of that.  He is also had CT imaging within the past 3 years which looked okay.  I think he probably has functional bowel disorder, he endorses a longstanding history of bloating like this in the past.  I will check CBC and c-Met  to ensure okay.  I discussed pathophysiology behind intestinal gas and bloating, recommend trial of low FODMAP diet to see if that helps at all.  I also recommend a trial of Citrucel once daily to provide regularity to his bowel habits and hopefully minimize worsening bloating that can happen with other types of fibers.  I will give him some Bentyl to use as needed for this and he can try using it before bedtime to see if that helps.  If his symptoms do not improve in the next few weeks or worsen, or he has significant  lab abnormality, may pursue additional work-up.  I asked him to touch base with me and let me know how he is doing this regard in a few weeks.  Otherwise he has a small anal fissure in the posterior midline canal, nitroglycerin has worked in the past however he had some side effects from it.  We will give him some diltiazem/lidocaine ointment to see if he tolerates that better, and try the Citrucel as above.  Will await his course, he should contact me in a few weeks if no improvement.  [place] Cellar, MD [facility]

## 2019-08-07 NOTE — Patient Instructions (Addendum)
If you are age 68 or older, your body mass index should be between 23-30. Your Body mass index is 30.01 kg/m. If this is out of the aforementioned range listed, please consider follow up with your Primary Care Provider.  If you are age 29 or younger, your body mass index should be between 19-25. Your Body mass index is 30.01 kg/m. If this is out of the aformentioned range listed, please consider follow up with your Primary Care Provider.   To help prevent the possible spread of infection to our patients, communities, and staff; we will be implementing the following measures:  As of now we are not allowing any visitors/family members to accompany you to any upcoming appointments with [facility]. If you have any concerns about this please contact our office to discuss prior to the appointment.   Please go to the lab in the basement of our building to have lab work done as you leave today. Hit "B" for basement when you get on the elevator.  When the doors open the lab is on your left.  We will call you with the results. Thank you.  We are giving you a Low FOD-MAP diet to follow.  Please purchase the following medications over the counter and take as directed: Citrucel powder: Take once daily  We have sent the following medications to your pharmacy for you to pick up at your convenience: Bentyl 10mg :  Take one to two tablets at bedtime and then again during the day as needed for abdominal cramps.   We have sent a prescription for Diltiazem gel to [facility]. You should apply a pea size amount to your rectum three times daily for 4 weeks or until healed  [facility] information is below: Address: [address] [place], [place] [address]  [phone]  *Please DO NOT go directly from our office to pick up this medication! Give the pharmacy 1 day to process the prescription as this is compounded and takes time to make.  Please call us in 2 weeks if you  are not better.  Thank you for entrusting me with your care and for choosing [facility], Dr. Cerritos Cellar

## 2019-09-13 NOTE — Progress Notes (Signed)
Primary Physician/Referring:  Jani Gravel, MD  Patient ID: John Bird, male    DOB: May 04, 1951, 68 y.o.   MRN: [id number]  Chief Complaint  Patient presents with  . Hypertension  . Bradycardia    HPI: John Bird  is a 68 y.o. male  with hypertension, prior tobacco use disorder with approximately 10-15-pack-year history, quit remotely, mild hyperlipidemia, small 3 cm AAA, family history of premature coronary artery disease in his brother having MI at age 44Y, dyspnea on exertion associated with chest discomfort, nuclear stress test on 09/17/2018 revealing mild inferior ischemia.  He last seen approximately 2 months ago blood pressure continued to be elevated.  He was started on atenolol and amlodipine was increased to 10 mg daily and atenolol added.  He now presents for follow-up.  He is tolerating all his medications well and has not had recurrence of chest pain.  Feels well.  Continues to remain active, main issue is anal fissure which is still painful.  He has been evaluated by GI.  States that his blood pressure since medications were changed 2 months ago, has been very well controlled and tolerating all of them without any complications.  Past Medical History:  Diagnosis Date  . Allergy   . Anal fissure   . Cancer (HCC)    basal cell CA- forehead  . Hemorrhoids   . Hernia, inguinal   . Hiatal hernia   . IBS (irritable bowel syndrome)   . Ulcer     Past Surgical History:  Procedure Laterality Date  . FINGER SURGERY     Left Pinky finger  . INGUINAL HERNIA REPAIR     bilateral  . NASAL SEPTUM SURGERY    . right knee surgery    . TONSILLECTOMY AND ADENOIDECTOMY      Social History   Socioeconomic History  . Marital status: Married    Spouse name: Not on file  . Number of children: 2  . Years of education: Not on file  . Highest education level: Not on file  Occupational History  . Occupation: retired  Scientific laboratory technician  . Financial resource strain: Not on file   . Food insecurity    Worry: Not on file    Inability: Not on file  . Transportation needs    Medical: Not on file    Non-medical: Not on file  Tobacco Use  . Smoking status: Former Smoker    Packs/day: 1.50    Years: 15.00    Pack years: 22.50    Quit date: 10/11/1980    Years since quitting: 38.9  . Smokeless tobacco: Never Used  Substance and Sexual Activity  . Alcohol use: Yes    Comment: 2-3 drinks per MONTH  . Drug use: No  . Sexual activity: Not on file  Lifestyle  . Physical activity    Days per week: Not on file    Minutes per session: Not on file  . Stress: Not on file  Relationships  . Social Herbalist on phone: Not on file    Gets together: Not on file    Attends religious service: Not on file    Active member of club or organization: Not on file    Attends meetings of clubs or organizations: Not on file    Relationship status: Not on file  . Intimate partner violence    Fear of current or ex partner: Not on file    Emotionally abused: Not on  file    Physically abused: Not on file    Forced sexual activity: Not on file  Other Topics Concern  . Not on file  Social History Narrative  . Not on file    Current Outpatient Medications on File Prior to Visit  Medication Sig Dispense Refill  . AMBULATORY NON FORMULARY MEDICATION Medication Name: Diltiazem 2% gel:  Apply a pea sized amount rectally three times a day for 4 weeks or until healed 30 g 0  . amLODipine (NORVASC) 10 MG tablet Take 1 tablet (10 mg total) by mouth daily. 90 tablet 3  . aspirin EC 81 MG tablet Take 81 mg by mouth daily.    Marland Kitchen atenolol (TENORMIN) 50 MG tablet Take 1 tablet (50 mg total) by mouth every evening. 30 tablet 2  . cetirizine (ZYRTEC) 10 MG tablet Take 10 mg by mouth as needed.     . dicyclomine (BENTYL) 10 MG capsule Take 1-2 capsules (10-20 mg total) by mouth 2 (two) times daily. Take one to two tablets at bedtime and then again during the day as needed 60 capsule 5  .  methylcellulose (CITRUCEL) oral powder Take 1 packet by mouth daily.    Marland Kitchen omeprazole (PRILOSEC) 20 MG capsule Take 20 mg by mouth. OTC Mon, Wed, Frid    . rosuvastatin (CRESTOR) 10 MG tablet TAKE 1 TABLET BY MOUTH EVERY DAY 90 tablet 1  . valsartan-hydrochlorothiazide (DIOVAN-HCT) 320-12.5 MG tablet TAKE 1 TABLET BY MOUTH EVERY DAY IN THE MORNING 90 tablet 1   No current facility-administered medications on file prior to visit.    Review of Systems  Constitution: Negative for chills, decreased appetite, malaise/fatigue and weight gain.  Cardiovascular: Negative for dyspnea on exertion, leg swelling and syncope.  Endocrine: Negative for cold intolerance.  Hematologic/Lymphatic: Does not bruise/bleed easily.  Musculoskeletal: Negative for joint swelling.  Gastrointestinal: Positive for hemorrhoids (fissure and pain). Negative for abdominal pain, anorexia, change in bowel habit, hematochezia and melena.  Neurological: Negative for headaches and light-headedness.  Psychiatric/Behavioral: Negative for depression and substance abuse.  All other systems reviewed and are negative.   Vitals with BMI 09/14/2019 09/14/2019 08/07/2019  Height - _0  _1   Weight - 195 lbs 191 lbs 10 oz  BMI - 04.88 30  Systolic 891 694 503  Diastolic 79 68 72  Pulse - 47 64    Physical Exam  Constitutional: He appears well-developed. No distress.  Mildly obese  HENT:  Head: Atraumatic.  Eyes: Conjunctivae are normal.  Neck: Neck supple. No JVD present. No thyromegaly present.  Cardiovascular: Regular rhythm, normal heart sounds, intact distal pulses and normal pulses. Bradycardia present. Exam reveals no gallop.  No murmur heard. Pulmonary/Chest: Effort normal and breath sounds normal.  Abdominal: Soft. Bowel sounds are normal.  Musculoskeletal: Normal range of motion.        General: No edema.  Neurological: He is alert.  Skin: Skin is warm and dry.  Psychiatric: He has a normal mood and affect.    Radiology: No results found.  Laboratory examination:    CMP Latest Ref Rng & Units 08/07/2019 12/26/2018  Glucose 70 - 99 mg/dL 99 96  BUN 6 - 23 mg/dL 22 22  Creatinine 0.40 - 1.50 mg/dL 1.23 1.23  Sodium 135 - 145 mEq/L 138 146(H)  Potassium 3.5 - 5.1 mEq/L 4.8 5.0  Chloride 96 - 112 mEq/L 103 105  CO2 19 - 32 mEq/L 32 25  Calcium 8.4 - 10.5 mg/dL 9.5 9.0  Total Protein 6.0 - 8.3 g/dL 7.3 6.6  Total Bilirubin 0.2 - 1.2 mg/dL 0.7 0.7  Alkaline Phos 39 - 117 U/L 83 102  AST 0 - 37 U/L 25 18  ALT 0 - 53 U/L 25 19   CBC Latest Ref Rng & Units 08/07/2019  WBC 4.0 - 10.5 K/uL 7.9  Hemoglobin 13.0 - 17.0 g/dL 14.0  Hematocrit 39.0 - 52.0 % 41.8  Platelets 150.0 - 400.0 K/uL 239.0   Lipid Panel     Component Value Date/Time   CHOL 133 12/26/2018 0958   TRIG 67 12/26/2018 0958   HDL 41 12/26/2018 0958   LDLCALC 79 12/26/2018 0958   09/26/2018: Cholesterol 156, triglycerides 67, HDL 44, LDL 99.  Creatinine 1.2, EGFR 62/72, potassium 4.4, CMP normal.  Labs 07/24/2018: Potassium 4.3, BUN 17, creatinine 1.2.  EGFR 60/69 mL, CMP otherwise normal.  HB 13.6/HCT 40.2, platelets 227.  Total cholesterol 181, triglycerides 81, HDL 44, LDL 120.  HEMOGLOBIN A1C No results found for: HGBA1C, MPG TSH No results for input(s): TSH in the last 8760 hours.  Cardiac Studies:   Echocardiogram 09/20/2018: 1. Left ventricle cavity is normal in size. Mild concentric hypertrophy of the left ventricle. Normal global wall motion. Doppler evidence of grade II (pseudonormal) diastolic dysfunction, elevated LAP. Calculated EF 66%. 2. Left atrial cavity is mildly dilated. 3. Trace aortic regurgitation. 4. Mild (Grade I) mitral regurgitation. 5. Trace tricuspid regurgitation.  Lexiscan Sestamibi stress test 09/15/2018: 1. Lexiscan stress test was performed. Exercise capacity was not assessed. Stress symptoms included chest pressure, dyspnea, syncope. Resting blood pressure 138/82 mmHg, dropped to  peak effect blood pressure of 82/40 mmHg. The resting and stress electrocardiogram demonstrated normal sinus rhythm, normal resting conduction, no resting arrhythmias and normal rest repolarization. Stress EKG is non diagnostic for ischemia as it is a pharmacologic stress. 2. The overall quality of the study is good. Left ventricular cavity is noted to be normal on the rest and stress studies. Gated SPECT images reveal normal myocardial thickening and wall motion. The left ventricular ejection fraction was calculated or visually estimated to be 68%. SPECT images reveal small size, mild intensity, reversible perfusion defect in basal inferior myocardium suggestive of ischemia. 3. Low risk study.  Abdominal aortic duplex 09/19/2018: Moderate dilatation of the abdominal aorta is noted in the distal aorta. Mild heterogeneous plaque noted. Normal flow velocities noted. A distal abdominal aortic aneurysm measuring 3.14 x 2.42 x 1.12 cm is seen. Recheck in 1 year.   Assessment     ICD-10-CM   1. Essential hypertension  I10 EKG 12-Lead  2. Bradycardia by electrocardiogram  R00.1   3. AAA (abdominal aortic aneurysm) without rupture ([place])  I71.4     EKG 09/14/2019: Marked sinus bradycardia at rate of 44 bpm, otherwise normal EKG. No significant change from  EKG 05/04/2019: Marked sinus bradycardia at rate of 49 bpm.   Recommendations:   John Bird  is a 68 y.o. Caucasian  male  with hypertension, prior 10-15-pack-year  cigarette use, quit remotely, mild hyperlipidemia, small 3 cm AAA, family history of premature coronary artery disease in his brother having MI at age 75Y, dyspnea on exertion associated with chest discomfort, nuclear stress test on 09/17/2018 revealing mild inferior ischemia.  Patient is here on a 6-week office visit and follow-up of hypertension, on his last office visit increased amlodipine to 10 mg.  He is tolerating this without any side effects, he has not had any leg edema.  He has underlying sinus bradycardia but appears to be tolerating atenolol 50 mg daily that was added on last visit, no change in spite of addition of atenolol. He does have probably CAD by abnormal stress and anginal symptoms in past and is being managed medically with no recurrence of chest pain.    I will see him back in the office in 12 months. He does have AAA and surveillance Dopplers have been set up for Dec 2020, but will postpone to 1 year from now (very small), discussed symptoms of rupture.     Adrian Prows, MD, [facility] 09/14/2019, 2:56 PM [facility] [place] Pager: [phone] Office: [phone] If no answer Cell [phone]

## 2019-09-14 ENCOUNTER — Encounter: Payer: Self-pay | Admitting: Cardiology

## 2019-09-14 ENCOUNTER — Other Ambulatory Visit: Payer: Self-pay

## 2019-09-14 ENCOUNTER — Ambulatory Visit (INDEPENDENT_AMBULATORY_CARE_PROVIDER_SITE_OTHER): Payer: Medicare Other | Admitting: Cardiology

## 2019-09-14 VITALS — BP 147/79 | HR 47 | Temp 98.0°F | Ht 67.0 in | Wt 195.0 lb

## 2019-09-14 DIAGNOSIS — I714 Abdominal aortic aneurysm, without rupture, unspecified: Secondary | ICD-10-CM

## 2019-09-14 DIAGNOSIS — I10 Essential (primary) hypertension: Secondary | ICD-10-CM | POA: Diagnosis not present

## 2019-09-14 DIAGNOSIS — I209 Angina pectoris, unspecified: Secondary | ICD-10-CM

## 2019-09-14 DIAGNOSIS — R001 Bradycardia, unspecified: Secondary | ICD-10-CM | POA: Diagnosis not present

## 2019-09-14 MED ORDER — ATENOLOL 50 MG PO TABS: 50.0000 mg | ORAL_TABLET | Freq: Every evening | ORAL | 3 refills | Status: DC

## 2019-09-25 ENCOUNTER — Other Ambulatory Visit: Payer: Medicare Other

## 2020-01-01 ENCOUNTER — Other Ambulatory Visit: Payer: Self-pay | Admitting: Cardiology

## 2020-01-01 DIAGNOSIS — E785 Hyperlipidemia, unspecified: Secondary | ICD-10-CM

## 2020-01-01 DIAGNOSIS — I10 Essential (primary) hypertension: Secondary | ICD-10-CM

## 2020-03-24 DIAGNOSIS — Z86018 Personal history of other benign neoplasm: Secondary | ICD-10-CM | POA: Diagnosis not present

## 2020-03-24 DIAGNOSIS — L821 Other seborrheic keratosis: Secondary | ICD-10-CM | POA: Diagnosis not present

## 2020-03-24 DIAGNOSIS — D1724 Benign lipomatous neoplasm of skin and subcutaneous tissue of left leg: Secondary | ICD-10-CM | POA: Diagnosis not present

## 2020-03-24 DIAGNOSIS — D2262 Melanocytic nevi of left upper limb, including shoulder: Secondary | ICD-10-CM | POA: Diagnosis not present

## 2020-03-24 DIAGNOSIS — L910 Hypertrophic scar: Secondary | ICD-10-CM | POA: Diagnosis not present

## 2020-03-24 DIAGNOSIS — L570 Actinic keratosis: Secondary | ICD-10-CM | POA: Diagnosis not present

## 2020-03-24 DIAGNOSIS — D2261 Melanocytic nevi of right upper limb, including shoulder: Secondary | ICD-10-CM | POA: Diagnosis not present

## 2020-03-24 DIAGNOSIS — Z85828 Personal history of other malignant neoplasm of skin: Secondary | ICD-10-CM | POA: Diagnosis not present

## 2020-03-24 DIAGNOSIS — D225 Melanocytic nevi of trunk: Secondary | ICD-10-CM | POA: Diagnosis not present

## 2020-03-24 DIAGNOSIS — D2272 Melanocytic nevi of left lower limb, including hip: Secondary | ICD-10-CM | POA: Diagnosis not present

## 2020-03-24 DIAGNOSIS — L578 Other skin changes due to chronic exposure to nonionizing radiation: Secondary | ICD-10-CM | POA: Diagnosis not present

## 2020-04-07 ENCOUNTER — Telehealth: Payer: Self-pay

## 2020-04-07 NOTE — Telephone Encounter (Signed)
Pt called wanting to know if he needs to have labs done prior to pending appt. Please advise and call pt.//ah

## 2020-04-21 ENCOUNTER — Encounter: Payer: Self-pay | Admitting: Cardiology

## 2020-04-21 ENCOUNTER — Other Ambulatory Visit: Payer: Self-pay

## 2020-04-21 ENCOUNTER — Ambulatory Visit: Payer: Medicare Other | Admitting: Cardiology

## 2020-04-21 VITALS — BP 140/78 | HR 50 | Resp 16 | Ht 67.0 in | Wt 196.0 lb

## 2020-04-21 DIAGNOSIS — N401 Enlarged prostate with lower urinary tract symptoms: Secondary | ICD-10-CM

## 2020-04-21 DIAGNOSIS — I10 Essential (primary) hypertension: Secondary | ICD-10-CM

## 2020-04-21 DIAGNOSIS — E7800 Pure hypercholesterolemia, unspecified: Secondary | ICD-10-CM

## 2020-04-21 MED ORDER — SPIRONOLACTONE 25 MG PO TABS: 25.0000 mg | ORAL_TABLET | ORAL | 2 refills | Status: DC

## 2020-04-21 MED ORDER — TERAZOSIN HCL 2 MG PO CAPS: 2.0000 mg | ORAL_CAPSULE | Freq: Every day | ORAL | 3 refills | Status: DC

## 2020-04-21 NOTE — Progress Notes (Signed)
Primary Physician/Referring:  Jani Gravel, MD  Patient ID: John Bird, male    DOB: [birth year], 69 y.o.   MRN: [id number]  Chief Complaint  Patient presents with  . Leg Pain  . Hypertension  . Follow-up    HPI: John Bird  is a 69 y.o. male  with hypertension, prior tobacco use disorder with approximately 10-15-pack-year history, quit remotely, mild hyperlipidemia, small 3 cm AAA, family history of premature coronary artery disease in his brother having MI at age 61Y, nuclear stress test on 09/17/2018 revealing mild inferior ischemia performed for chest pain and dyspnea on exertion.   He is tolerating all his medications well and has not had recurrence of chest pain.  Feels well.  Continues to remain active, main issue is anal fissure which is still painful and also states that he is extremely bloated and gassy and disturbs his sleep due to him being on fiber supplement.  He has also noticed frequency of urination and dripping.   Past Medical History:  Diagnosis Date  . Allergy   . Anal fissure   . Cancer (HCC)    basal cell CA- forehead  . Hemorrhoids   . Hernia, inguinal   . Hiatal hernia   . IBS (irritable bowel syndrome)   . Ulcer     Past Surgical History:  Procedure Laterality Date  . FINGER SURGERY     Left Pinky finger  . INGUINAL HERNIA REPAIR     bilateral  . NASAL SEPTUM SURGERY    . right knee surgery    . TONSILLECTOMY AND ADENOIDECTOMY     Social History   Tobacco Use  . Smoking status: Former Smoker    Packs/day: 1.50    Years: 15.00    Pack years: 22.50    Types: Cigarettes    Quit date: 10/11/1980    Years since quitting: 39.5  . Smokeless tobacco: Never Used  Substance Use Topics  . Alcohol use: Yes    Comment: 2-3 drinks per MONTH   Marital Status: Married   Current Outpatient Medications on File Prior to Visit  Medication Sig Dispense Refill  . aspirin EC 81 MG tablet Take 81 mg by mouth daily.    Marland Kitchen atenolol (TENORMIN) 50 MG tablet  Take 1 tablet (50 mg total) by mouth every evening. 90 tablet 3  . methylcellulose (CITRUCEL) oral powder Take 1 packet by mouth daily.    Marland Kitchen omeprazole (PRILOSEC) 20 MG capsule Take 20 mg by mouth. OTC Mon, Wed, Frid    . rosuvastatin (CRESTOR) 10 MG tablet TAKE 1 TABLET BY MOUTH EVERY DAY 90 tablet 1  . valsartan-hydrochlorothiazide (DIOVAN-HCT) 320-12.5 MG tablet TAKE 1 TABLET BY MOUTH EVERY DAY IN THE MORNING 90 tablet 1   No current facility-administered medications on file prior to visit.   Review of Systems  Cardiovascular: Negative for chest pain, dyspnea on exertion and leg swelling.  Gastrointestinal: Positive for constipation, flatus and hemorrhoids. Negative for melena.  Genitourinary: Positive for frequency.  Neurological: Positive for dizziness (occasional).    Vitals with BMI 04/21/2020 09/14/2019 09/14/2019  Height 5' 7"  - 5' 7"   Weight 196 lbs - 195 lbs  BMI 40.10 - 27.25  Systolic 366 440 347  Diastolic 78 79 68  Pulse 50 - 47    Physical Exam Constitutional:      Appearance: He is well-developed.     Comments: Mildly obese  Neck:     Thyroid: No thyromegaly.  Vascular: No JVD.  Cardiovascular:     Rate and Rhythm: Regular rhythm. Bradycardia present.     Pulses: Normal pulses and intact distal pulses.     Heart sounds: Normal heart sounds. No murmur heard.  No gallop.   Pulmonary:     Effort: Pulmonary effort is normal.     Breath sounds: Normal breath sounds.  Abdominal:     General: Bowel sounds are normal.     Palpations: Abdomen is soft.    Radiology: No results found.  Laboratory examination:    CMP Latest Ref Rng & Units 08/07/2019 12/26/2018  Glucose 70 - 99 mg/dL 99 96  BUN 6 - 23 mg/dL 22 22  Creatinine 0.40 - 1.50 mg/dL 1.23 1.23  Sodium 135 - 145 mEq/L 138 146(H)  Potassium 3.5 - 5.1 mEq/L 4.8 5.0  Chloride 96 - 112 mEq/L 103 105  CO2 19 - 32 mEq/L 32 25  Calcium 8.4 - 10.5 mg/dL 9.5 9.0  Total Protein 6.0 - 8.3 g/dL 7.3 6.6  Total  Bilirubin 0.2 - 1.2 mg/dL 0.7 0.7  Alkaline Phos 39 - 117 U/L 83 102  AST 0 - 37 U/L 25 18  ALT 0 - 53 U/L 25 19   CBC Latest Ref Rng & Units 08/07/2019  WBC 4.0 - 10.5 K/uL 7.9  Hemoglobin 13.0 - 17.0 g/dL 14.0  Hematocrit 39 - 52 % 41.8  Platelets 150 - 400 K/uL 239.0   Lipid Panel     Component Value Date/Time   CHOL 133 12/26/2018 0958   TRIG 67 12/26/2018 0958   HDL 41 12/26/2018 0958   LDLCALC 79 12/26/2018 0958   09/26/2018: Cholesterol 156, triglycerides 67, HDL 44, LDL 99.  Creatinine 1.2, EGFR 62/72, potassium 4.4, CMP normal.  Labs 07/24/2018: Potassium 4.3, BUN 17, creatinine 1.2.  EGFR 60/69 mL, CMP otherwise normal.  HB 13.6/HCT 40.2, platelets 227.  Total cholesterol 181, triglycerides 81, HDL 44, LDL 120.  HEMOGLOBIN A1C No results found for: HGBA1C, MPG TSH No results for input(s): TSH in the last 8760 hours.  Cardiac Studies:   Echocardiogram 09/20/2018: 1. Left ventricle cavity is normal in size. Mild concentric hypertrophy of the left ventricle. Normal global wall motion. Doppler evidence of grade II (pseudonormal) diastolic dysfunction, elevated LAP. Calculated EF 66%. 2. Left atrial cavity is mildly dilated. 3. Trace aortic regurgitation. 4. Mild (Grade I) mitral regurgitation. 5. Trace tricuspid regurgitation.  Lexiscan Sestamibi stress test 09/15/2018: 1. Lexiscan stress test was performed. Exercise capacity was not assessed. Stress symptoms included chest pressure, dyspnea, syncope. Resting blood pressure 138/82 mmHg, dropped to peak effect blood pressure of 82/40 mmHg. The resting and stress electrocardiogram demonstrated normal sinus rhythm, normal resting conduction, no resting arrhythmias and normal rest repolarization. Stress EKG is non diagnostic for ischemia as it is a pharmacologic stress. 2. The overall quality of the study is good. Left ventricular cavity is noted to be normal on the rest and stress studies. Gated SPECT images reveal normal  myocardial thickening and wall motion. The left ventricular ejection fraction was calculated or visually estimated to be 68%. SPECT images reveal small size, mild intensity, reversible perfusion defect in basal inferior myocardium suggestive of ischemia. 3. Low risk study.  Abdominal aortic duplex 09/19/2018: Moderate dilatation of the abdominal aorta is noted in the distal aorta. Mild heterogeneous plaque noted. Normal flow velocities noted. A distal abdominal aortic aneurysm measuring 3.14 x 2.42 x 1.12 cm is seen. Recheck in 1 year.   EKG:  EKG 04/21/2020: Marked sinus bradycardia 48 bpm, leftward axis, no evidence of ischemia. No significant change from EKG 09/14/2019: Marked sinus bradycardia at rate of 44 bpm.   Assessment     ICD-10-CM   1. Essential (primary) hypertension  I10 EKG 12-Lead    spironolactone (ALDACTONE) 25 MG tablet    Basic metabolic panel  2. Benign prostatic hyperplasia with urinary frequency  N40.1 terazosin (HYTRIN) 2 MG capsule   R35.0   3. Hypercholesteremia  E78.00     EKG 09/14/2019: Marked sinus bradycardia at rate of 44 bpm, otherwise normal EKG. No significant change from  EKG 05/04/2019: Marked sinus bradycardia at rate of 49 bpm.   Recommendations:   John Bird  is a 69 y.o. Caucasian  male  with hypertension, prior 10-15-pack-year  cigarette use, quit remotely, mild hyperlipidemia, small 3 cm AAA, family history of premature coronary artery disease in his brother having MI at age 61Y, dyspnea on exertion associated with chest discomfort, nuclear stress test on 09/17/2018 revealing mild inferior ischemia.  This is a 18-monthoffice visit, he has been having significant amount of constipation and also severe gas leading to disturbed sleep, he is also using Citrucel.  I will discontinue amlodipine to see whether his constipation will improve, advised him to change to Metamucil and start Citrucel and added Aldactone 25 mg daily for hypertension as he has  noticed his blood pressure to be high, will obtain BMP in 2 weeks and I would like to see him back in 4 to 6 weeks for follow-up.  Otherwise from cardiac standpoint he has not had any recurrence of angina pectoris, lipids are well controlled.  He also complains of frequency of urination and slow drip, in view of hypertension, I will try Hytrin 2 mg capsules in the evening.   JAdrian Prows MD, [facility]7/09/2020, 11:03 PM Office: [phone]

## 2020-04-22 ENCOUNTER — Telehealth: Payer: Self-pay

## 2020-04-22 NOTE — Telephone Encounter (Signed)
Stop until he sees me ot see if body aches go away

## 2020-04-22 NOTE — Telephone Encounter (Signed)
Is this patient supposed to stop taking Rosuvastatin . I didn't see that in your notes. ?

## 2020-05-06 DIAGNOSIS — I10 Essential (primary) hypertension: Secondary | ICD-10-CM | POA: Diagnosis not present

## 2020-05-07 LAB — BASIC METABOLIC PANEL
BUN/Creatinine Ratio: 16 (ref 10–24)
BUN: 24 mg/dL (ref 8–27)
CO2: 22 mmol/L (ref 20–29)
Calcium: 8.8 mg/dL (ref 8.6–10.2)
Chloride: 102 mmol/L (ref 96–106)
Creatinine, Ser: 1.54 mg/dL — ABNORMAL HIGH (ref 0.76–1.27)
GFR calc Af Amer: 52 mL/min/{1.73_m2} — ABNORMAL LOW (ref >59)
GFR calc non Af Amer: 45 mL/min/{1.73_m2} — ABNORMAL LOW (ref >59)
Glucose: 100 mg/dL — ABNORMAL HIGH (ref 65–99)
Potassium: 4.3 mmol/L (ref 3.5–5.2)
Sodium: 138 mmol/L (ref 134–144)

## 2020-05-08 NOTE — Addendum Note (Signed)
Addended by: Kela Millin on: 05/08/2020 05:28 AM   Modules accepted: Orders

## 2020-05-08 NOTE — Progress Notes (Signed)
Please ask the patient: S. Cr worse with aldactone. Means kidney function is getting bad. Is your BP stable and improved.? If improved, I will repeat the BMP in 2 weeks if not improved to stop the medication and still do BMP in 2 weeks, orders placed.

## 2020-05-09 ENCOUNTER — Telehealth: Payer: Self-pay

## 2020-05-09 NOTE — Telephone Encounter (Signed)
Called to discuss results with patient. He doesn't think he should stop the medication at this time because he has not been monitoring his blood pressure and doesn't know if the Aldactone is helping or not. Patient says he will contact you through mychart to give Bp readings on Wednesday and wait for your advice on stopping the medication.

## 2020-05-09 NOTE — Telephone Encounter (Signed)
-----   Message from Adrian Prows, MD sent at 05/08/2020  5:28 AM EDT ----- Please ask the patient: S. Cr worse with aldactone. Means kidney function is getting bad. Is your BP stable and improved.? If improved, I will repeat the BMP in 2 weeks if not improved to stop the medication and still do BMP in 2 weeks, orders placed.

## 2020-05-13 NOTE — Telephone Encounter (Signed)
From patient.

## 2020-05-14 DIAGNOSIS — I10 Essential (primary) hypertension: Secondary | ICD-10-CM | POA: Diagnosis not present

## 2020-05-14 NOTE — Telephone Encounter (Signed)
Please read, from patient.

## 2020-05-15 ENCOUNTER — Other Ambulatory Visit: Payer: Self-pay | Admitting: Cardiology

## 2020-05-15 LAB — BASIC METABOLIC PANEL
BUN/Creatinine Ratio: 15 (ref 10–24)
BUN: 24 mg/dL (ref 8–27)
CO2: 25 mmol/L (ref 20–29)
Calcium: 9.1 mg/dL (ref 8.6–10.2)
Chloride: 103 mmol/L (ref 96–106)
Creatinine, Ser: 1.64 mg/dL — ABNORMAL HIGH (ref 0.76–1.27)
GFR calc Af Amer: 49 mL/min/{1.73_m2} — ABNORMAL LOW (ref >59)
GFR calc non Af Amer: 42 mL/min/{1.73_m2} — ABNORMAL LOW (ref >59)
Glucose: 95 mg/dL (ref 65–99)
Potassium: 4.8 mmol/L (ref 3.5–5.2)
Sodium: 141 mmol/L (ref 134–144)

## 2020-05-15 MED ORDER — AMLODIPINE BESYLATE 5 MG PO TABS: 5.0000 mg | ORAL_TABLET | Freq: Every day | ORAL | 2 refills | Status: DC

## 2020-05-15 MED ORDER — ISOSORBIDE DINITRATE 30 MG PO TABS: 30.0000 mg | ORAL_TABLET | Freq: Three times a day (TID) | ORAL | 2 refills | Status: DC

## 2020-05-15 MED ORDER — HYDRALAZINE HCL 25 MG PO TABS: 25.0000 mg | ORAL_TABLET | Freq: Three times a day (TID) | ORAL | 2 refills | Status: DC

## 2020-05-15 NOTE — Addendum Note (Signed)
Addended by: Kela Millin on: 05/15/2020 07:29 AM   Modules accepted: Orders

## 2020-05-15 NOTE — Progress Notes (Signed)
Called and spoke with patient regarding his lab results. He understands to STOP Spironolactone and to HOLD Valsartan for 1 week. He will pick up new Rx sent to pharmacy. And continue to take Metamucil one tablespoon every night. He also understands that he is to repeat blood work in 1 week.

## 2020-05-15 NOTE — Telephone Encounter (Signed)
From pt

## 2020-05-15 NOTE — Progress Notes (Signed)
Please stop Aldactone (spiranolactone). Renal function is still worse, It was mildly depressed previously. Please do not be overly concerned, it is reversible. Also hold Valsartan HCT for 1 week.   I am sending two new Rx for BP but you have to take them three times a day. Hydralazine and isosorbide dinitrate.  I had stopped Amlodipine due to constipation, but will have to start 5 mg daily you were on 10 mg daily when you developed constipation. Please continue to use Metamucil one tablespoonful every night.   Will recheck labs in 1 week.  Please call and let patient know this

## 2020-05-19 NOTE — Telephone Encounter (Signed)
Spoke with patient in regards to attaching a document via [place]. Patient voiced understanding and will fax documents.

## 2020-05-22 ENCOUNTER — Encounter: Payer: Self-pay | Admitting: Pharmacist

## 2020-05-22 DIAGNOSIS — I10 Essential (primary) hypertension: Secondary | ICD-10-CM | POA: Diagnosis not present

## 2020-05-22 NOTE — Telephone Encounter (Deleted)
Pain in both legs (muscle pain/aches)

## 2020-05-22 NOTE — Telephone Encounter (Deleted)
107/49 58  Leg pain  Sleep issues  Started valsartan today.

## 2020-05-23 LAB — BASIC METABOLIC PANEL
BUN/Creatinine Ratio: 16 (ref 10–24)
BUN: 22 mg/dL (ref 8–27)
CO2: 20 mmol/L (ref 20–29)
Calcium: 8.4 mg/dL — ABNORMAL LOW (ref 8.6–10.2)
Chloride: 108 mmol/L — ABNORMAL HIGH (ref 96–106)
Creatinine, Ser: 1.38 mg/dL — ABNORMAL HIGH (ref 0.76–1.27)
GFR calc Af Amer: 60 mL/min/{1.73_m2} (ref >59)
GFR calc non Af Amer: 52 mL/min/{1.73_m2} — ABNORMAL LOW (ref >59)
Glucose: 138 mg/dL — ABNORMAL HIGH (ref 65–99)
Potassium: 4.2 mmol/L (ref 3.5–5.2)
Sodium: 142 mmol/L (ref 134–144)

## 2020-05-23 NOTE — Progress Notes (Signed)
Renal function back to near normal and expect to improve. Will need to re-evaluate his medications and probably consider renal MRI without contrast for renal artery stenosis

## 2020-06-06 ENCOUNTER — Other Ambulatory Visit: Payer: Self-pay | Admitting: Cardiology

## 2020-06-06 DIAGNOSIS — I10 Essential (primary) hypertension: Secondary | ICD-10-CM

## 2020-06-06 NOTE — Telephone Encounter (Signed)
This encounter was created in error - please disregard.

## 2020-06-11 ENCOUNTER — Ambulatory Visit: Payer: Medicare Other | Admitting: Cardiology

## 2020-06-11 ENCOUNTER — Encounter: Payer: Self-pay | Admitting: Cardiology

## 2020-06-11 ENCOUNTER — Other Ambulatory Visit: Payer: Self-pay

## 2020-06-11 VITALS — BP 138/69 | HR 65 | Resp 16 | Ht 67.0 in | Wt 194.4 lb

## 2020-06-11 DIAGNOSIS — N401 Enlarged prostate with lower urinary tract symptoms: Secondary | ICD-10-CM | POA: Diagnosis not present

## 2020-06-11 DIAGNOSIS — I10 Essential (primary) hypertension: Secondary | ICD-10-CM

## 2020-06-11 DIAGNOSIS — I129 Hypertensive chronic kidney disease with stage 1 through stage 4 chronic kidney disease, or unspecified chronic kidney disease: Secondary | ICD-10-CM | POA: Diagnosis not present

## 2020-06-11 DIAGNOSIS — R350 Frequency of micturition: Secondary | ICD-10-CM | POA: Diagnosis not present

## 2020-06-11 DIAGNOSIS — R001 Bradycardia, unspecified: Secondary | ICD-10-CM

## 2020-06-11 DIAGNOSIS — N1831 Chronic kidney disease, stage 3a: Secondary | ICD-10-CM | POA: Diagnosis not present

## 2020-06-11 MED ORDER — TERAZOSIN HCL 5 MG PO CAPS: 5.0000 mg | ORAL_CAPSULE | Freq: Every day | ORAL | 3 refills | Status: DC

## 2020-06-11 NOTE — Progress Notes (Signed)
Primary Physician/Referring:  Jani Gravel, MD  Patient ID: John Bird, male    DOB: 11-Aug-1951, 69 y.o.   MRN: [id number]  Chief Complaint  Patient presents with  . Hypertension  . Follow-up    6 week    HPI: John Bird  is a 69 y.o. male Caucasian  male  with chronic asymptomatic sinus bradycardia, hyperlipidemia,  prior 10-15-pack-year  cigarette use, quit remotely,  small 3 cm AAA, family history of premature coronary artery disease in his brother having MI at age 52Y, chronic stable angina and DOE with nuclear stress test on 09/17/2018 revealing mild inferior ischemia.  On his last office visit in July 2021, I had added Hytrin for urinary frequency, uncontrolled hypertension, also added Aldactone 25 mg daily.  He had developed worsening renal function and hence I have asked him to discontinue Aldactone and to hold valsartan for a week.  Repeat BMP had shown improvement in renal function.  He has underlying stage III chronic kidney disease.  Since addition of amlodipine, and he resumed valsartan, blood pressure has been well controlled and he brings in his home recordings, average blood pressure has been 160/71 mmHg.  Otherwise he is presently doing well.  Past Medical History:  Diagnosis Date  . Allergy   . Anal fissure   . Cancer (HCC)    basal cell CA- forehead  . Hemorrhoids   . Hernia, inguinal   . Hiatal hernia   . IBS (irritable bowel syndrome)   . Ulcer     Past Surgical History:  Procedure Laterality Date  . FINGER SURGERY     Left Pinky finger  . INGUINAL HERNIA REPAIR     bilateral  . NASAL SEPTUM SURGERY    . right knee surgery    . TONSILLECTOMY AND ADENOIDECTOMY     Social History   Tobacco Use  . Smoking status: Former Smoker    Packs/day: 1.50    Years: 15.00    Pack years: 22.50    Types: Cigarettes    Quit date: 10/11/1980    Years since quitting: 39.6  . Smokeless tobacco: Never Used  Substance Use Topics  . Alcohol use: Yes     Comment: 2-3 drinks per MONTH   Marital Status: Married   Current Outpatient Medications on File Prior to Visit  Medication Sig Dispense Refill  . amLODipine (NORVASC) 5 MG tablet Take 1 tablet (5 mg total) by mouth daily. 30 tablet 2  . aspirin EC 81 MG tablet Take 81 mg by mouth daily.    Marland Kitchen atenolol (TENORMIN) 50 MG tablet Take 1 tablet (50 mg total) by mouth every evening. 90 tablet 3  . methylcellulose (CITRUCEL) oral powder Take 1 packet by mouth daily.    Marland Kitchen omeprazole (PRILOSEC) 20 MG capsule Take 20 mg by mouth. OTC Mon, Wed, Frid    . rosuvastatin (CRESTOR) 10 MG tablet TAKE 1 TABLET BY MOUTH EVERY DAY 90 tablet 1  . valsartan-hydrochlorothiazide (DIOVAN-HCT) 320-12.5 MG tablet TAKE 1 TABLET BY MOUTH EVERY DAY IN THE MORNING 90 tablet 1   No current facility-administered medications on file prior to visit.   Review of Systems  Cardiovascular: Negative for chest pain, dyspnea on exertion and leg swelling.  Gastrointestinal: Positive for constipation and hemorrhoids. Negative for flatus and melena.  Genitourinary: Positive for frequency.  Neurological: Positive for dizziness (occasional).    Vitals with BMI 06/11/2020 06/11/2020 04/21/2020  Height - _0  _1   Weight -  194 lbs 6 oz 196 lbs  BMI - 00.34 91.79  Systolic 150 569 794  Diastolic 69 72 78  Pulse 65 63 50    Physical Exam Constitutional:      Appearance: He is well-developed.     Comments: Mildly obese  Neck:     Thyroid: No thyromegaly.     Vascular: No JVD.  Cardiovascular:     Rate and Rhythm: Regular rhythm. Bradycardia present.     Pulses: Normal pulses and intact distal pulses.     Heart sounds: Normal heart sounds. No murmur heard.  No gallop.   Pulmonary:     Effort: Pulmonary effort is normal.     Breath sounds: Normal breath sounds.  Abdominal:     General: Bowel sounds are normal.     Palpations: Abdomen is soft.    Radiology: No results found.  Laboratory examination:    CMP Latest Ref  Rng & Units 05/22/2020 05/14/2020 05/06/2020  Glucose 65 - 99 mg/dL 138(H) 95 100(H)  BUN 8 - 27 mg/dL _0 Creatinine 0.76 - 1.27 mg/dL 1.38(H) 1.64(H) 1.54(H)  Sodium 134 - 144 mmol/L 142 141 138  Potassium 3.5 - 5.2 mmol/L 4.2 4.8 4.3  Chloride 96 - 106 mmol/L 108(H) 103 102  CO2 20 - 29 mmol/L _1 Calcium 8.6 - 10.2 mg/dL 8.4(L) 9.1 8.8  Total Protein 6.0 - 8.3 g/dL - - -  Total Bilirubin 0.2 - 1.2 mg/dL - - -  Alkaline Phos 39 - 117 U/L - - -  AST 0 - 37 U/L - - -  ALT 0 - 53 U/L - - -   CBC Latest Ref Rng & Units 08/07/2019  WBC 4.0 - 10.5 K/uL 7.9  Hemoglobin 13.0 - 17.0 g/dL 14.0  Hematocrit 39 - 52 % 41.8  Platelets 150 - 400 K/uL 239.0   Lipid Panel     Component Value Date/Time   CHOL 133 12/26/2018 0958   TRIG 67 12/26/2018 0958   HDL 41 12/26/2018 0958   LDLCALC 79 12/26/2018 0958   External labs:   09/26/2018: Cholesterol 156, triglycerides 67, HDL 44, LDL 99.  Creatinine 1.2, EGFR 62/72, potassium 4.4, CMP normal.  Labs 07/24/2018: Potassium 4.3, BUN 17, creatinine 1.2.  EGFR 60/69 mL, CMP otherwise normal.  HB 13.6/HCT 40.2, platelets 227.  Total cholesterol 181, triglycerides 81, HDL 44, LDL 120.  HEMOGLOBIN A1C No results found for: HGBA1C, MPG TSH No results for input(s): TSH in the last 8760 hours.  Cardiac Studies:   Echocardiogram 09/20/2018: 1. Left ventricle cavity is normal in size. Mild concentric hypertrophy of the left ventricle. Normal global wall motion. Doppler evidence of grade II (pseudonormal) diastolic dysfunction, elevated LAP. Calculated EF 66%. 2. Left atrial cavity is mildly dilated. 3. Trace aortic regurgitation. 4. Mild (Grade I) mitral regurgitation. 5. Trace tricuspid regurgitation.  Lexiscan Sestamibi stress test 09/15/2018: 1. Lexiscan stress test was performed. Exercise capacity was not assessed. Stress symptoms included chest pressure, dyspnea, syncope. Resting blood pressure 138/82 mmHg, dropped to peak effect  blood pressure of 82/40 mmHg. The resting and stress electrocardiogram demonstrated normal sinus rhythm, normal resting conduction, no resting arrhythmias and normal rest repolarization. Stress EKG is non diagnostic for ischemia as it is a pharmacologic stress. 2. The overall quality of the study is good. Left ventricular cavity is noted to be normal on the rest and stress studies. Gated SPECT images reveal normal myocardial thickening and wall motion. The left  ventricular ejection fraction was calculated or visually estimated to be 68%. SPECT images reveal small size, mild intensity, reversible perfusion defect in basal inferior myocardium suggestive of ischemia. 3. Low risk study.  Abdominal aortic duplex 09/19/2018: Moderate dilatation of the abdominal aorta is noted in the distal aorta. Mild heterogeneous plaque noted. Normal flow velocities noted. A distal abdominal aortic aneurysm measuring 3.14 x 2.42 x 1.12 cm is seen. Recheck in 1 year.   EKG:  EKG 04/21/2020: Marked sinus bradycardia 48 bpm, leftward axis, no evidence of ischemia. No significant change from EKG 09/14/2019: Marked sinus bradycardia at rate of 44 bpm.   Assessment     ICD-10-CM   1. Essential (primary) hypertension  I10   2. Bradycardia by electrocardiogram  R00.1   3. Stage 3a chronic kidney disease  N18.31   4. Benign prostatic hyperplasia with urinary frequency  N40.1 terazosin (HYTRIN) 5 MG capsule   R35.0     Medications Discontinued During This Encounter  Medication Reason  . hydrALAZINE (APRESOLINE) 25 MG tablet Side effect (s)  . isosorbide dinitrate (ISORDIL) 30 MG tablet Side effect (s)  . terazosin (HYTRIN) 2 MG capsule     Meds ordered this encounter  Medications  . terazosin (HYTRIN) 5 MG capsule    Sig: Take 1 capsule (5 mg total) by mouth at bedtime.    Dispense:  90 capsule    Refill:  3   Recommendations:   John Bird  is a 69 y.o. Caucasian  male  with chronic asymptomatic sinus  bradycardia, hyperlipidemia,  prior 10-15-pack-year  cigarette use, quit remotely,  small 3 cm AAA, family history of premature coronary artery disease in his brother having MI at age 30Y, chronic stable angina and DOE with nuclear stress test on 09/17/2018 revealing mild inferior ischemia.  On his last office visit in July 2021, I had added Hytrin for urinary frequency, uncontrolled hypertension, also added Aldactone 25 mg daily.  He had developed worsening renal function and hence I have asked him to discontinue Aldactone and to hold valsartan for a week.  Repeat BMP had shown improvement in renal function.  He has underlying stage III chronic kidney disease.  I reviewed his prior labs from 2019, he already had very mild stage III chronic kidney disease.  I discussed this with the patient.  He now presents for follow-up of uncontrolled hypertension. BP is now well controlled with addition of  hytrin and he still has frequency, increase the dose to 5 mg daily, which will also help with improving his blood pressure control.  He has had 1 episode of hypotension, advised him to lay down with his feet and he stopped if he ever has an episode of hypotension.  I have also advised him that he can take 1 extra dose of amlodipine if his blood pressure is ever greater than 233 mmHg systolic.  His average blood pressure has been 130/61 mmHg.  Otherwise from cardiac standpoint he has not had any recurrence of angina pectoris, lipids are well controlled.     Adrian Prows, MD, [facility] 06/11/2020, 10:19 AM Office: [phone]

## 2020-06-27 DIAGNOSIS — Z23 Encounter for immunization: Secondary | ICD-10-CM | POA: Diagnosis not present

## 2020-07-11 ENCOUNTER — Telehealth: Payer: Self-pay | Admitting: Cardiology

## 2020-07-11 DIAGNOSIS — R55 Syncope and collapse: Secondary | ICD-10-CM | POA: Diagnosis not present

## 2020-07-11 DIAGNOSIS — R52 Pain, unspecified: Secondary | ICD-10-CM | POA: Diagnosis not present

## 2020-07-11 DIAGNOSIS — I959 Hypotension, unspecified: Secondary | ICD-10-CM | POA: Diagnosis not present

## 2020-07-11 DIAGNOSIS — R0902 Hypoxemia: Secondary | ICD-10-CM | POA: Diagnosis not present

## 2020-07-11 DIAGNOSIS — R42 Dizziness and giddiness: Secondary | ICD-10-CM | POA: Diagnosis not present

## 2020-07-11 NOTE — Telephone Encounter (Signed)
From pt

## 2020-07-11 NOTE — Telephone Encounter (Signed)
Patient called and advised that he was in the back of an ambulance because his BP has dropped very low and wasn't sure if he should go to the hospital . He requested to speak with Dr, Einar Gip . After 10 mins EMS checked his BP and it went up to where he believed he did not need to go to ER but requested that you call him back at [phone] . Thank you

## 2020-07-14 ENCOUNTER — Other Ambulatory Visit: Payer: Self-pay | Admitting: Cardiology

## 2020-07-14 DIAGNOSIS — I10 Essential (primary) hypertension: Secondary | ICD-10-CM

## 2020-07-15 ENCOUNTER — Other Ambulatory Visit: Payer: Self-pay | Admitting: Cardiology

## 2020-07-15 DIAGNOSIS — N401 Enlarged prostate with lower urinary tract symptoms: Secondary | ICD-10-CM

## 2020-07-28 NOTE — Telephone Encounter (Signed)
From pt

## 2020-07-28 NOTE — Telephone Encounter (Signed)
I would like you to enroll in our hypertension clinic and remote/principal care management. Out pharmacist Manuela Schwartz will be in touch with you. He will explain the process.   I sent this message to the patient. JG

## 2020-07-29 NOTE — Telephone Encounter (Signed)
Called and reviewed. Pt denies any complains of lightheadedness, dizziness, falls, CP, HA, SOB. Reviewed concerns of recent labile BP readings. Requested pt to come by the office tomorrow to review home BP readings and current antihypertensive medication dosing schedule.

## 2020-07-30 NOTE — Telephone Encounter (Signed)
Enrolled pt in RPM/PCM. Pt with concerns of labile BP reading. Pt unsure of how to adequately take his antihypertensive medications. Reviewed in great detail his medications indications, side effects, dosing schedule. Current antihypertensive includes atenolol 50 mg (PM), hydralazine 25 mg TID, terazosin 5 mg (PM), valsartan-HCTZ 320/12.5 mg (AM). Pt has amlodipine and spironolactone at home that would recommend continuing holding while home BP readings are reviewed. Pt unable to tolerate isosorbide with complains of lightheadedness, dizziness, and near-syncope. Recent complains of increased nocturia likely due to taking his valsartan-HCTZ in the evening. Terazosin dose increased from 2 mg to 5 mg recently.

## 2020-08-10 DIAGNOSIS — I10 Essential (primary) hypertension: Secondary | ICD-10-CM | POA: Diagnosis not present

## 2020-08-18 DIAGNOSIS — Z0000 Encounter for general adult medical examination without abnormal findings: Secondary | ICD-10-CM | POA: Diagnosis not present

## 2020-08-18 DIAGNOSIS — E7800 Pure hypercholesterolemia, unspecified: Secondary | ICD-10-CM | POA: Diagnosis not present

## 2020-08-18 DIAGNOSIS — I714 Abdominal aortic aneurysm, without rupture: Secondary | ICD-10-CM | POA: Diagnosis not present

## 2020-08-18 DIAGNOSIS — I10 Essential (primary) hypertension: Secondary | ICD-10-CM | POA: Diagnosis not present

## 2020-08-18 DIAGNOSIS — R0683 Snoring: Secondary | ICD-10-CM | POA: Diagnosis not present

## 2020-08-18 DIAGNOSIS — Z125 Encounter for screening for malignant neoplasm of prostate: Secondary | ICD-10-CM | POA: Diagnosis not present

## 2020-08-18 DIAGNOSIS — K219 Gastro-esophageal reflux disease without esophagitis: Secondary | ICD-10-CM | POA: Diagnosis not present

## 2020-08-20 DIAGNOSIS — R7989 Other specified abnormal findings of blood chemistry: Secondary | ICD-10-CM | POA: Diagnosis not present

## 2020-09-09 DIAGNOSIS — I10 Essential (primary) hypertension: Secondary | ICD-10-CM | POA: Diagnosis not present

## 2020-09-12 ENCOUNTER — Other Ambulatory Visit: Payer: Self-pay

## 2020-09-12 ENCOUNTER — Other Ambulatory Visit: Payer: Self-pay | Admitting: Cardiology

## 2020-09-12 ENCOUNTER — Ambulatory Visit: Payer: Medicare Other | Attending: Internal Medicine

## 2020-09-12 ENCOUNTER — Other Ambulatory Visit: Payer: Medicare Other

## 2020-09-12 DIAGNOSIS — Z23 Encounter for immunization: Secondary | ICD-10-CM

## 2020-09-12 DIAGNOSIS — I714 Abdominal aortic aneurysm, without rupture, unspecified: Secondary | ICD-10-CM

## 2020-09-12 DIAGNOSIS — I10 Essential (primary) hypertension: Secondary | ICD-10-CM

## 2020-09-12 DIAGNOSIS — I700 Atherosclerosis of aorta: Secondary | ICD-10-CM

## 2020-09-12 NOTE — Progress Notes (Signed)
   Covid-19 Vaccination Clinic  Name:  John Bird    MRN: [id number] DOB: 01/08/1951  09/12/2020  [name] was observed post Covid-19 immunization for 15 minutes without incident. He was provided with Vaccine Information Sheet and instruction to access the V-Safe system.   [name] was instructed to call 911 with any severe reactions post vaccine: Marland Kitchen Difficulty breathing  . Swelling of face and throat  . A fast heartbeat  . A bad rash all over body  . Dizziness and weakness   Immunizations Administered    Name Date Dose VIS Date Route   Pfizer COVID-19 Vaccine 09/12/2020  1:40 PM 0.3 mL 07/30/2020 Intramuscular   Manufacturer: [place]   Lot: [id number]   [place]: [id number]

## 2020-09-13 ENCOUNTER — Other Ambulatory Visit: Payer: Self-pay | Admitting: Cardiology

## 2020-09-13 DIAGNOSIS — I700 Atherosclerosis of aorta: Secondary | ICD-10-CM

## 2020-09-13 DIAGNOSIS — I714 Abdominal aortic aneurysm, without rupture, unspecified: Secondary | ICD-10-CM

## 2020-09-19 ENCOUNTER — Ambulatory Visit: Payer: PRIVATE HEALTH INSURANCE | Admitting: Cardiology

## 2020-09-23 ENCOUNTER — Telehealth: Payer: Self-pay

## 2020-09-23 NOTE — Telephone Encounter (Signed)
Patient called regarding his duplex results pt would like to know his results please advise

## 2020-09-23 NOTE — Telephone Encounter (Signed)
John Bird, I am unable to see it in the work flow. Normal aortic duplex. No aneurysm. Previous result wrong. Also CT abdomen does not show aneurysm.   Abdominal Aortic Duplex 09/12/2020: The maximum aorta (sac) diameter is 2.33 cm (dist) with minimal ectasia in the distal aorta. Moderate heterogeneous plaque observed in the proximal, mid and distal aorta.  Normal flow velocities noted in the iliac vessels proximally.  No AAA noted. Compared to 09/19/2018, 3 cm AAA not present and probably was a measurement error previously.

## 2020-09-24 NOTE — Telephone Encounter (Signed)
Are you able to see the aorta duplex in your workflow now?

## 2020-09-25 NOTE — Telephone Encounter (Signed)
Spoke to patient regarding his results he is aware

## 2020-09-25 NOTE — Telephone Encounter (Signed)
No

## 2020-09-25 NOTE — Telephone Encounter (Signed)
No answer left a vm will try again later

## 2020-09-29 ENCOUNTER — Other Ambulatory Visit: Payer: Medicare Other

## 2020-09-29 DIAGNOSIS — Z20822 Contact with and (suspected) exposure to covid-19: Secondary | ICD-10-CM

## 2020-09-30 ENCOUNTER — Emergency Department (HOSPITAL_BASED_OUTPATIENT_CLINIC_OR_DEPARTMENT_OTHER)
Admission: EM | Admit: 2020-09-30 | Discharge: 2020-09-30 | Disposition: A | Discharge status: Home / Self Care | Payer: Medicare Other | Attending: Emergency Medicine | Admitting: Emergency Medicine

## 2020-09-30 ENCOUNTER — Encounter (HOSPITAL_BASED_OUTPATIENT_CLINIC_OR_DEPARTMENT_OTHER): Payer: Self-pay | Admitting: Emergency Medicine

## 2020-09-30 ENCOUNTER — Emergency Department (HOSPITAL_BASED_OUTPATIENT_CLINIC_OR_DEPARTMENT_OTHER): Payer: Medicare Other

## 2020-09-30 ENCOUNTER — Other Ambulatory Visit: Payer: Self-pay

## 2020-09-30 DIAGNOSIS — Z87891 Personal history of nicotine dependence: Secondary | ICD-10-CM | POA: Diagnosis not present

## 2020-09-30 DIAGNOSIS — M79606 Pain in leg, unspecified: Secondary | ICD-10-CM | POA: Diagnosis not present

## 2020-09-30 DIAGNOSIS — Z85828 Personal history of other malignant neoplasm of skin: Secondary | ICD-10-CM | POA: Diagnosis not present

## 2020-09-30 DIAGNOSIS — R079 Chest pain, unspecified: Secondary | ICD-10-CM | POA: Diagnosis not present

## 2020-09-30 DIAGNOSIS — R0781 Pleurodynia: Secondary | ICD-10-CM | POA: Diagnosis present

## 2020-09-30 DIAGNOSIS — M542 Cervicalgia: Secondary | ICD-10-CM | POA: Insufficient documentation

## 2020-09-30 DIAGNOSIS — Z20822 Contact with and (suspected) exposure to covid-19: Secondary | ICD-10-CM | POA: Insufficient documentation

## 2020-09-30 DIAGNOSIS — I10 Essential (primary) hypertension: Secondary | ICD-10-CM | POA: Diagnosis not present

## 2020-09-30 DIAGNOSIS — R072 Precordial pain: Secondary | ICD-10-CM | POA: Diagnosis not present

## 2020-09-30 DIAGNOSIS — J90 Pleural effusion, not elsewhere classified: Secondary | ICD-10-CM | POA: Diagnosis not present

## 2020-09-30 LAB — CBC
HCT: 40.4 % (ref 39.0–52.0)
Hemoglobin: 13.5 g/dL (ref 13.0–17.0)
MCH: 30.8 pg (ref 26.0–34.0)
MCHC: 33.4 g/dL (ref 30.0–36.0)
MCV: 92 fL (ref 80.0–100.0)
Platelets: 197 10*3/uL (ref 150–400)
RBC: 4.39 MIL/uL (ref 4.22–5.81)
RDW: 13.2 % (ref 11.5–15.5)
WBC: 4.1 10*3/uL (ref 4.0–10.5)
nRBC: 0 % (ref 0.0–0.2)

## 2020-09-30 LAB — BASIC METABOLIC PANEL
Anion gap: 8 (ref 5–15)
BUN: 18 mg/dL (ref 8–23)
CO2: 23 mmol/L (ref 22–32)
Calcium: 8.4 mg/dL — ABNORMAL LOW (ref 8.9–10.3)
Chloride: 105 mmol/L (ref 98–111)
Creatinine, Ser: 1.21 mg/dL (ref 0.61–1.24)
GFR, Estimated: >60 mL/min (ref >60)
Glucose, Bld: 109 mg/dL — ABNORMAL HIGH (ref 70–99)
Potassium: 4.1 mmol/L (ref 3.5–5.1)
Sodium: 136 mmol/L (ref 135–145)

## 2020-09-30 LAB — TROPONIN I (HIGH SENSITIVITY)
Troponin I (High Sensitivity): 3 ng/L (ref <18)
Troponin I (High Sensitivity): 3 ng/L (ref <18)

## 2020-09-30 LAB — HEPATIC FUNCTION PANEL
ALT: 28 U/L (ref 0–44)
AST: 27 U/L (ref 15–41)
Albumin: 3.9 g/dL (ref 3.5–5.0)
Alkaline Phosphatase: 65 U/L (ref 38–126)
Bilirubin, Direct: 0.1 mg/dL (ref 0.0–0.2)
Indirect Bilirubin: 0.7 mg/dL (ref 0.3–0.9)
Total Bilirubin: 0.8 mg/dL (ref 0.3–1.2)
Total Protein: 7 g/dL (ref 6.5–8.1)

## 2020-09-30 LAB — RESP PANEL BY RT-PCR (FLU A&B, COVID) ARPGX2
Influenza A by PCR: NEGATIVE
Influenza B by PCR: NEGATIVE
SARS Coronavirus 2 by RT PCR: NEGATIVE

## 2020-09-30 LAB — LIPASE, BLOOD: Lipase: 27 U/L (ref 11–51)

## 2020-09-30 MED ORDER — DOXYCYCLINE HYCLATE 100 MG PO CAPS: 100.0000 mg | ORAL_CAPSULE | Freq: Two times a day (BID) | ORAL | 0 refills | Status: AC

## 2020-09-30 NOTE — ED Triage Notes (Addendum)
Intermittent left sided rib pain with cough since Saturday.  Productive of clear to yellow sputum.  Unknown fever.  Some leg and neck pain.  No sob.  No N/V/D.  Stated CPAP on Friday?

## 2020-09-30 NOTE — ED Provider Notes (Signed)
Emergency Department Provider Note   I have reviewed the triage vital signs and the nursing notes.   HISTORY  Chief Complaint Chest Pain   HPI John Bird is a 69 y.o. male presents to the ED with CP. Pain is left sided and worse with cough or touching the area. No injury recalled. No pleuritic pain. No central CP, SOB, or diaphoresis. Notes a cough with some yellow sputum. No abdominal or back pain. Patient with several days of symptoms. With no clear improvement in symptoms he seeks ED care today.    Past Medical History:  Diagnosis Date  . Allergy   . Anal fissure   . Cancer (HCC)    basal cell CA- forehead  . Hemorrhoids   . Hernia, inguinal   . Hiatal hernia   . IBS (irritable bowel syndrome)   . Ulcer     Patient Active Problem List   Diagnosis Date Noted  . AAA (abdominal aortic aneurysm) without rupture ([place]) 02/04/2019  . Family history of premature CAD: brother with MI at age 71 Y 02/04/2019  . Lipoma of lower extremity 03/29/2014  . Left groin pain 04/11/2013  . Anal fissure 12/31/2010  . HYPERTENSION 07/18/2009  . GERD 07/18/2009  . IRRITABLE BOWEL SYNDROME 07/18/2009  . FLATULENCE-GAS-BLOATING 07/18/2009    Past Surgical History:  Procedure Laterality Date  . FINGER SURGERY     Left Pinky finger  . INGUINAL HERNIA REPAIR     bilateral  . NASAL SEPTUM SURGERY    . right knee surgery    . TONSILLECTOMY AND ADENOIDECTOMY    . TONSILLECTOMY AND ADENOIDECTOMY      Allergies Patient has no known allergies.  Family History  Problem Relation Age of Onset  . Heart disease Father   . Heart disease Brother   . Colon cancer Neg Hx   . Esophageal cancer Neg Hx   . Rectal cancer Neg Hx   . Stomach cancer Neg Hx     Social History Social History   Tobacco Use  . Smoking status: Former Smoker    Packs/day: 1.50    Years: 15.00    Pack years: 22.50    Types: Cigarettes    Quit date: 10/11/1980    Years since quitting: 40.0  . Smokeless  tobacco: Never Used  Vaping Use  . Vaping Use: Never used  Substance Use Topics  . Alcohol use: Yes    Comment: 2-3 drinks per MONTH  . Drug use: No    Review of Systems  Constitutional: No fever/chills Eyes: No visual changes. ENT: No sore throat. Cardiovascular: Positive chest pain. Respiratory: Denies shortness of breath. Positive cough.  Gastrointestinal: No abdominal pain.  No nausea, no vomiting.  No diarrhea.  No constipation. Genitourinary: Negative for dysuria. Musculoskeletal: Negative for back pain. Skin: Negative for rash. Neurological: Negative for headaches, focal weakness or numbness.  10-point ROS otherwise negative.  ____________________________________________   PHYSICAL EXAM:  VITAL SIGNS: ED Triage Vitals  Enc Vitals Group     BP 09/30/20 1248 (!) 147/74     Pulse Rate 09/30/20 1248 60     Resp 09/30/20 1248 15     Temp 09/30/20 1248 98.9 F (37.2 C)     Temp Source 09/30/20 1248 Oral     SpO2 09/30/20 1248 99 %     Weight 09/30/20 1249 190 lb (86.2 kg)     Height 09/30/20 1249 5\' 7"  (1.702 m)   Constitutional: Alert and oriented. Well  appearing and in no acute distress. Eyes: Conjunctivae are normal. Head: Atraumatic. Nose: No congestion/rhinnorhea. Mouth/Throat: Mucous membranes are moist.  Neck: No stridor.  Cardiovascular: Normal rate, regular rhythm. Good peripheral circulation. Grossly normal heart sounds.   Respiratory: Normal respiratory effort.  No retractions. Lungs CTAB. Gastrointestinal: Soft and nontender. No distention.  Musculoskeletal: No lower extremity tenderness nor edema. No gross deformities of extremities. Mild tenderness to palpation over the left lateral chest wall.  Neurologic:  Normal speech and language. No gross focal neurologic deficits are appreciated.  Skin:  Skin is warm, dry and intact. No rash noted.  ____________________________________________   LABS (all labs ordered are listed, but only abnormal  results are displayed)  Labs Reviewed  BASIC METABOLIC PANEL - Abnormal; Notable for the following components:      Result Value   Glucose, Bld 109 (*)    Calcium 8.4 (*)    All other components within normal limits  RESP PANEL BY RT-PCR (FLU A&B, COVID) ARPGX2  CBC  HEPATIC FUNCTION PANEL  LIPASE, BLOOD  TROPONIN I (HIGH SENSITIVITY)  TROPONIN I (HIGH SENSITIVITY)   ____________________________________________  EKG   EKG Interpretation  Date/Time:  Tuesday September 30 2020 12:41:43 EST Ventricular Rate:  56 PR Interval:    QRS Duration: 109 QT Interval:  444 QTC Calculation: 429 R Axis:   -15 Text Interpretation: Sinus rhythm Borderline left axis deviation Low voltage, extremity and precordial leads No STEMI Confirmed by Nanda Quinton [phone]) on 09/30/2020 12:48:02 PM       ____________________________________________  RADIOLOGY  DG Chest Portable 1 View  Result Date: 09/30/2020 CLINICAL DATA:  Chest pain EXAM: PORTABLE CHEST 1 VIEW COMPARISON:  01/25/2010 FINDINGS: The heart size and mediastinal contours are within normal limits. Mild diffuse interstitial prominence. No focal airspace consolidation, pleural effusion, or pneumothorax. The visualized skeletal structures are unremarkable. IMPRESSION: Mild diffuse interstitial prominence which may reflect bronchitic type lung changes versus edema or atypical/viral infection. Electronically Signed   By: Davina Poke D.O.   On: 09/30/2020 13:49    ____________________________________________   PROCEDURES  Procedure(s) performed:   Procedures  None ____________________________________________   INITIAL IMPRESSION / ASSESSMENT AND PLAN / ED COURSE  Pertinent labs & imaging results that were available during my care of the patient were reviewed by me and considered in my medical decision making (see chart for details).   Patient presents to the ED with CP. ACS, PE, Dissection considered but much less likely.  Serial troponin negative. Pain reproducible. COVID testing sent and negative. EKG interpreted by me as above and negative. Plan for close PCP and Cardiology follow up. He has messaged his Cardiologist while in the ED and will follow up from home. Some question of PNA on CXR. With cough will start Doxycycline.    ____________________________________________  FINAL CLINICAL IMPRESSION(S) / ED DIAGNOSES  Final diagnoses:  Precordial chest pain    NEW OUTPATIENT MEDICATIONS STARTED DURING THIS VISIT:  Discharge Medication List as of 09/30/2020  3:42 PM    START taking these medications   Details  doxycycline (VIBRAMYCIN) 100 MG capsule Take 1 capsule (100 mg total) by mouth 2 (two) times daily for 7 days., Starting Tue 09/30/2020, Until Tue 10/07/2020, Normal        Note:  This document was prepared using Dragon voice recognition software and may include unintentional dictation errors.  Nanda Quinton, MD, [facility]    Lacharles Altschuler, [facility], MD 10/05/20 Thom Chimes

## 2020-09-30 NOTE — ED Notes (Signed)
Portable Xray at bedside.

## 2020-09-30 NOTE — Discharge Instructions (Signed)
You were seen in the emerge department today with chest pain.  Your lab work here was largely unremarkable.  Your Covid and flu test were negative.  Your chest x-ray did not show any obvious pneumonia but given your symptoms I will start you on antibiotic which I called into your pharmacy.  Please call your cardiologist today to set up a follow-up appointment.  While I have a low suspicion at this pain is coming from your heart he may choose to do additional tests to fully rule this out.  In the meantime, if you develop sudden severe worsening symptoms he should return to the emergency department for reevaluation or call 911.

## 2020-09-30 NOTE — ED Notes (Signed)
Specimen collection device provided to patient per request

## 2020-10-01 ENCOUNTER — Telehealth: Payer: Self-pay

## 2020-10-01 LAB — NOVEL CORONAVIRUS, NAA: SARS-CoV-2, NAA: NOT DETECTED

## 2020-10-01 LAB — SARS-COV-2, NAA 2 DAY TAT

## 2020-10-01 NOTE — Telephone Encounter (Signed)
Patient wife called on DPR  She was inforned that her husbands COVID-19 was negative. 09/29/20.  She verbalized understanding.

## 2020-10-10 DIAGNOSIS — I10 Essential (primary) hypertension: Secondary | ICD-10-CM | POA: Diagnosis not present

## 2020-10-24 ENCOUNTER — Other Ambulatory Visit: Payer: Self-pay | Admitting: Cardiology

## 2020-10-24 DIAGNOSIS — I10 Essential (primary) hypertension: Secondary | ICD-10-CM

## 2020-10-24 DIAGNOSIS — E785 Hyperlipidemia, unspecified: Secondary | ICD-10-CM

## 2020-10-29 DIAGNOSIS — E7800 Pure hypercholesterolemia, unspecified: Secondary | ICD-10-CM | POA: Diagnosis not present

## 2020-11-10 DIAGNOSIS — I10 Essential (primary) hypertension: Secondary | ICD-10-CM | POA: Diagnosis not present

## 2020-12-03 DIAGNOSIS — Z20822 Contact with and (suspected) exposure to covid-19: Secondary | ICD-10-CM | POA: Diagnosis not present

## 2020-12-06 DIAGNOSIS — E871 Hypo-osmolality and hyponatremia: Secondary | ICD-10-CM | POA: Diagnosis not present

## 2020-12-06 DIAGNOSIS — R55 Syncope and collapse: Secondary | ICD-10-CM | POA: Diagnosis not present

## 2020-12-11 DIAGNOSIS — I10 Essential (primary) hypertension: Secondary | ICD-10-CM | POA: Diagnosis not present

## 2020-12-12 ENCOUNTER — Ambulatory Visit: Payer: PRIVATE HEALTH INSURANCE | Admitting: Cardiology

## 2020-12-15 DIAGNOSIS — G4719 Other hypersomnia: Secondary | ICD-10-CM | POA: Diagnosis not present

## 2020-12-15 DIAGNOSIS — I10 Essential (primary) hypertension: Secondary | ICD-10-CM | POA: Diagnosis not present

## 2020-12-15 DIAGNOSIS — G4733 Obstructive sleep apnea (adult) (pediatric): Secondary | ICD-10-CM | POA: Diagnosis not present

## 2020-12-15 DIAGNOSIS — G4761 Periodic limb movement disorder: Secondary | ICD-10-CM | POA: Diagnosis not present

## 2020-12-16 ENCOUNTER — Ambulatory Visit: Payer: Medicare Other | Admitting: Cardiology

## 2020-12-16 ENCOUNTER — Other Ambulatory Visit: Payer: Self-pay

## 2020-12-16 ENCOUNTER — Encounter: Payer: Self-pay | Admitting: Cardiology

## 2020-12-16 VITALS — BP 158/79 | HR 51 | Temp 98.6°F | Resp 17 | Ht 67.0 in | Wt 197.4 lb

## 2020-12-16 DIAGNOSIS — I10 Essential (primary) hypertension: Secondary | ICD-10-CM | POA: Diagnosis not present

## 2020-12-16 DIAGNOSIS — E871 Hypo-osmolality and hyponatremia: Secondary | ICD-10-CM

## 2020-12-16 DIAGNOSIS — N1831 Chronic kidney disease, stage 3a: Secondary | ICD-10-CM

## 2020-12-16 DIAGNOSIS — R55 Syncope and collapse: Secondary | ICD-10-CM

## 2020-12-16 NOTE — Progress Notes (Signed)
Primary Physician/Referring:  Jani Gravel, MD  Patient ID: John Bird, male    DOB: 1951/07/11, 70 y.o.   MRN: [id number]  Chief Complaint  Patient presents with  . Hypertension    6 month    HPI: John Bird  is a 70 y.o. male Caucasian  male  with chronic asymptomatic sinus bradycardia, hyperlipidemia,  prior 10-15-pack-year  cigarette use, quit remotely,  small 3 cm AAA, family history of premature coronary artery disease in his brother having MI at age 20Y, chronic stable angina and DOE with nuclear stress test on 09/17/2018 revealing mild inferior ischemia.  Patient was on a cruise on 12/06/2020, had an episode of syncope when he started to get up from the dining table.  Felt dizzy and blurred vision prior to passing out and urinated on himself.  Prior to this, patient states that he has had 3 alcoholic drinks and had just taken all 3 of his blood pressure medications.  Prior to this a month ago when he suddenly stood up from the barber chair, he also felt nearly syncopal and fell to the ground but did not lose consciousness.  Again he felt lightheaded and blurry eyed prior to this.  He has never had any occasions where he had sudden onset syncope without any warning symptoms.  Most of the symptoms occurred when he suddenly stood up.  He was evaluated by the physicians on the cruise ship and he brings EKG and labs with him.  Also states that his blood pressure has been well controlled except in the evening his blood pressure tends to increase.  He is very sensitive to blood pressure medications.  He is presently enrolled in our RPM and PCM in our office.  Past Medical History:  Diagnosis Date  . Allergy   . Anal fissure   . Cancer (HCC)    basal cell CA- forehead  . Hemorrhoids   . Hernia, inguinal   . Hiatal hernia   . IBS (irritable bowel syndrome)   . Ulcer     Past Surgical History:  Procedure Laterality Date  . FINGER SURGERY     Left Pinky finger  . INGUINAL  HERNIA REPAIR     bilateral  . NASAL SEPTUM SURGERY    . right knee surgery    . TONSILLECTOMY AND ADENOIDECTOMY    . TONSILLECTOMY AND ADENOIDECTOMY     Social History   Tobacco Use  . Smoking status: Former Smoker    Packs/day: 1.50    Years: 15.00    Pack years: 22.50    Types: Cigarettes    Quit date: 10/11/1980    Years since quitting: 40.2  . Smokeless tobacco: Never Used  Substance Use Topics  . Alcohol use: Yes    Comment: 2-3 drinks per MONTH   Marital Status: Married   Current Outpatient Medications on File Prior to Visit  Medication Sig Dispense Refill  . aspirin EC 81 MG tablet Take 81 mg by mouth daily.    Marland Kitchen atenolol (TENORMIN) 50 MG tablet TAKE 1 TABLET BY MOUTH EVERY EVENING 90 tablet 3  . omeprazole (PRILOSEC) 20 MG capsule Take 20 mg by mouth. OTC Mon, Wed, Frid    . rosuvastatin (CRESTOR) 10 MG tablet TAKE 1 TABLET BY MOUTH EVERY DAY 90 tablet 1  . terazosin (HYTRIN) 5 MG capsule Take 1 capsule (5 mg total) by mouth at bedtime. 90 capsule 3  . valsartan-hydrochlorothiazide (DIOVAN-HCT) 320-12.5 MG tablet TAKE 1 TABLET  BY MOUTH EVERY DAY IN THE MORNING (Patient taking differently: HALF A TABLET) 90 tablet 1  . hydrALAZINE (APRESOLINE) 25 MG tablet Take 1 tablet (25 mg total) by mouth 3 (three) times daily. Am and 4 pm and before bedtime 270 tablet 3   No current facility-administered medications on file prior to visit.   Review of Systems  Cardiovascular: Negative for chest pain, dyspnea on exertion and leg swelling.  Gastrointestinal: Positive for constipation and hemorrhoids. Negative for flatus and melena.  Genitourinary: Positive for frequency.  Neurological: Positive for dizziness (occasional).    Vitals with BMI 12/16/2020 12/16/2020 09/30/2020  Height - _0  -  Weight - 197 lbs 6 oz -  BMI - 20.94 -  Systolic 709 628 366  Diastolic 79 85 73  Pulse 51 55 54    Physical Exam Constitutional:      Appearance: He is well-developed.     Comments:  Mildly obese  Neck:     Thyroid: No thyromegaly.     Vascular: No JVD.  Cardiovascular:     Rate and Rhythm: Regular rhythm. Bradycardia present.     Pulses: Normal pulses and intact distal pulses.     Heart sounds: Normal heart sounds. No murmur heard. No gallop.   Pulmonary:     Effort: Pulmonary effort is normal.     Breath sounds: Normal breath sounds.  Abdominal:     General: Bowel sounds are normal.     Palpations: Abdomen is soft.    Radiology: No results found.  Laboratory examination:    CMP Latest Ref Rng & Units 09/30/2020 05/22/2020 05/14/2020  Glucose 70 - 99 mg/dL 109(H) 138(H) 95  BUN 8 - 23 mg/dL _1 Creatinine 0.61 - 1.24 mg/dL 1.21 1.38(H) 1.64(H)  Sodium 135 - 145 mmol/L 136 142 141  Potassium 3.5 - 5.1 mmol/L 4.1 4.2 4.8  Chloride 98 - 111 mmol/L 105 108(H) 103  CO2 22 - 32 mmol/L _2 Calcium 8.9 - 10.3 mg/dL 8.4(L) 8.4(L) 9.1  Total Protein 6.5 - 8.1 g/dL 7.0 - -  Total Bilirubin 0.3 - 1.2 mg/dL 0.8 - -  Alkaline Phos 38 - 126 U/L 65 - -  AST 15 - 41 U/L 27 - -  ALT 0 - 44 U/L 28 - -   CBC Latest Ref Rng & Units 09/30/2020 08/07/2019  WBC 4.0 - 10.5 K/uL 4.1 7.9  Hemoglobin 13.0 - 17.0 g/dL 13.5 14.0  Hematocrit 39.0 - 52.0 % 40.4 41.8  Platelets 150 - 400 K/uL 197 239.0   Lipid Panel     Component Value Date/Time   CHOL 133 12/26/2018 0958   TRIG 67 12/26/2018 0958   HDL 41 12/26/2018 0958   LDLCALC 79 12/26/2018 0958   External labs:   12/06/2020:   Hb 11.3/HCT 40.4, platelets 201, normal indicis.  CMP otherwise normal.  Sodium 127, potassium 4.0, serum glucose 149 mg.  121, creatinine 1.6.  Total cholesterol 98, triglycerides 211, HDL 38, LDL 70.   09/26/2018: Cholesterol 156, triglycerides 67, HDL 44, LDL 99.  Creatinine 1.2, EGFR 62/72, potassium 4.4, CMP normal.  Labs 07/24/2018: Potassium 4.3, BUN 17, creatinine 1.2.  EGFR 60/69 mL, CMP otherwise normal.  HB 13.6/HCT 40.2, platelets 227.  Total cholesterol 181,  triglycerides 81, HDL 44, LDL 120.  Cardiac Studies:   Echocardiogram 09/20/2018: 1. Left ventricle cavity is normal in size. Mild concentric hypertrophy of the left ventricle. Normal global wall motion. Doppler evidence of  grade II (pseudonormal) diastolic dysfunction, elevated LAP. Calculated EF 66%. 2. Left atrial cavity is mildly dilated. 3. Trace aortic regurgitation. 4. Mild (Grade I) mitral regurgitation. 5. Trace tricuspid regurgitation.  Lexiscan Sestamibi stress test 09/15/2018: 1. Lexiscan stress test was performed. Exercise capacity was not assessed. Stress symptoms included chest pressure, dyspnea, syncope. Resting blood pressure 138/82 mmHg, dropped to peak effect blood pressure of 82/40 mmHg. The resting and stress electrocardiogram demonstrated normal sinus rhythm, normal resting conduction, no resting arrhythmias and normal rest repolarization. Stress EKG is non diagnostic for ischemia as it is a pharmacologic stress. 2. The overall quality of the study is good. Left ventricular cavity is noted to be normal on the rest and stress studies. Gated SPECT images reveal normal myocardial thickening and wall motion. The left ventricular ejection fraction was calculated or visually estimated to be 68%. SPECT images reveal small size, mild intensity, reversible perfusion defect in basal inferior myocardium suggestive of ischemia. 3. Low risk study.  Abdominal aortic duplex 09/19/2018: Moderate dilatation of the abdominal aorta is noted in the distal aorta. Mild heterogeneous plaque noted. Normal flow velocities noted. A distal abdominal aortic aneurysm measuring 3.14 x 2.42 x 1.12 cm is seen. Recheck in 1 year.   EKG:  EKG 04/21/2020: Marked sinus bradycardia 48 bpm, leftward axis, no evidence of ischemia. No significant change from EKG 09/14/2019: Marked sinus bradycardia at rate of 44 bpm.   Assessment     ICD-10-CM   1. Essential (primary) hypertension  I10 EKG 12-Lead     hydrALAZINE (APRESOLINE) 25 MG tablet  2. Vasovagal syncope  R55 PCV CAROTID DUPLEX (BILATERAL)  3. Hyponatremia  K35.4 Basic metabolic panel  4. Stage 3a chronic kidney disease (HCC)  N18.31     Medications Discontinued During This Encounter  Medication Reason  . amLODipine (NORVASC) 5 MG tablet Error  . hydrALAZINE (APRESOLINE) 25 MG tablet     No orders of the defined types were placed in this encounter.  Recommendations:   SHAHID FLORI  is a 70 y.o. Caucasian  male  with chronic asymptomatic sinus bradycardia, hyperlipidemia,  prior 10-15-pack-year  cigarette use, quit remotely,  small 3 cm AAA, family history of premature coronary artery disease in his brother having MI at age 41Y, chronic stable angina and DOE with nuclear stress test on 09/17/2018 revealing mild inferior ischemia.  Patient was on a cruise on 12/06/2020, had an episode of syncope when he started to get up from the dining table.  Felt dizzy and blurred vision prior to passing out and urinated on himself.  Prior to this, patient states that he has had 3 alcoholic drinks and had just taken all 3 of his blood pressure medications. Prior to this a month ago when he suddenly stood up from the barber chair, he also felt nearly syncopal and fell to the ground but did not lose consciousness.    Symptoms are suggestive of vasovagal syncope.  He is concerned about carotid disease, will obtain carotid duplex.  However in view of bradycardia, if he continues to have any unexplained syncope or near syncope, we could consider long-term EKG monitoring versus loop recorder implantation as he had more than 2 episodes of near syncope.  His blood pressure is uncontrolled, he has reduced the dose of hydralazine to twice a day and advised him to increasing it to back to 3 times a day but take the afternoon dose at 4 PM and third dose during dinnertime.  Previously his blood pressure  was better controlled with amlodipine, but patient did not feel  well on amlodipine and stated that if his blood pressure drops too 130 mmHg or less he starts getting neck pain and marked dizziness.  Patient is presently enrolled in RPM in our office.  Otherwise stable from cardiac standpoint, I was going to see him back in 6 months however patient request that we change his appointment to 9 months from now so he can see me in January 2023.  If he has new symptoms or worsening symptoms he will contact me earlier.  Patient developed significant hyponatremia while on the cruise ship.  I will repeat BMP.    Adrian Prows, MD, [facility] 12/16/2020, 5:11 PM Office: [phone] Pager: [phone]

## 2020-12-19 DIAGNOSIS — H2513 Age-related nuclear cataract, bilateral: Secondary | ICD-10-CM | POA: Diagnosis not present

## 2020-12-19 DIAGNOSIS — H35033 Hypertensive retinopathy, bilateral: Secondary | ICD-10-CM | POA: Diagnosis not present

## 2020-12-19 DIAGNOSIS — H52223 Regular astigmatism, bilateral: Secondary | ICD-10-CM | POA: Diagnosis not present

## 2020-12-19 DIAGNOSIS — H5203 Hypermetropia, bilateral: Secondary | ICD-10-CM | POA: Diagnosis not present

## 2020-12-19 DIAGNOSIS — H524 Presbyopia: Secondary | ICD-10-CM | POA: Diagnosis not present

## 2020-12-19 DIAGNOSIS — I10 Essential (primary) hypertension: Secondary | ICD-10-CM | POA: Diagnosis not present

## 2021-01-01 ENCOUNTER — Other Ambulatory Visit: Payer: Self-pay

## 2021-01-01 ENCOUNTER — Ambulatory Visit: Payer: Medicare Other

## 2021-01-01 DIAGNOSIS — E871 Hypo-osmolality and hyponatremia: Secondary | ICD-10-CM | POA: Diagnosis not present

## 2021-01-01 DIAGNOSIS — R55 Syncope and collapse: Secondary | ICD-10-CM

## 2021-01-02 LAB — BASIC METABOLIC PANEL
BUN/Creatinine Ratio: 17 (ref 10–24)
BUN: 23 mg/dL (ref 8–27)
CO2: 21 mmol/L (ref 20–29)
Calcium: 8.6 mg/dL (ref 8.6–10.2)
Chloride: 107 mmol/L — ABNORMAL HIGH (ref 96–106)
Creatinine, Ser: 1.32 mg/dL — ABNORMAL HIGH (ref 0.76–1.27)
Glucose: 97 mg/dL (ref 65–99)
Potassium: 4.5 mmol/L (ref 3.5–5.2)
Sodium: 141 mmol/L (ref 134–144)
eGFR: 58 mL/min/{1.73_m2} — ABNORMAL LOW (ref >59)

## 2021-01-02 NOTE — Telephone Encounter (Signed)
From pt

## 2021-01-10 DIAGNOSIS — I10 Essential (primary) hypertension: Secondary | ICD-10-CM | POA: Diagnosis not present

## 2021-01-22 ENCOUNTER — Other Ambulatory Visit: Payer: Self-pay | Admitting: Cardiology

## 2021-01-22 DIAGNOSIS — I10 Essential (primary) hypertension: Secondary | ICD-10-CM

## 2021-02-09 DIAGNOSIS — I10 Essential (primary) hypertension: Secondary | ICD-10-CM | POA: Diagnosis not present

## 2021-02-13 DIAGNOSIS — D485 Neoplasm of uncertain behavior of skin: Secondary | ICD-10-CM | POA: Diagnosis not present

## 2021-02-13 DIAGNOSIS — L578 Other skin changes due to chronic exposure to nonionizing radiation: Secondary | ICD-10-CM | POA: Diagnosis not present

## 2021-02-13 DIAGNOSIS — L570 Actinic keratosis: Secondary | ICD-10-CM | POA: Diagnosis not present

## 2021-02-16 DIAGNOSIS — K219 Gastro-esophageal reflux disease without esophagitis: Secondary | ICD-10-CM | POA: Diagnosis not present

## 2021-02-16 DIAGNOSIS — I10 Essential (primary) hypertension: Secondary | ICD-10-CM | POA: Diagnosis not present

## 2021-02-16 DIAGNOSIS — M542 Cervicalgia: Secondary | ICD-10-CM | POA: Diagnosis not present

## 2021-03-10 ENCOUNTER — Other Ambulatory Visit: Payer: Self-pay | Admitting: Cardiology

## 2021-03-10 DIAGNOSIS — I10 Essential (primary) hypertension: Secondary | ICD-10-CM

## 2021-03-11 DIAGNOSIS — I10 Essential (primary) hypertension: Secondary | ICD-10-CM | POA: Diagnosis not present

## 2021-03-24 DIAGNOSIS — D485 Neoplasm of uncertain behavior of skin: Secondary | ICD-10-CM | POA: Diagnosis not present

## 2021-03-24 DIAGNOSIS — L905 Scar conditions and fibrosis of skin: Secondary | ICD-10-CM | POA: Diagnosis not present

## 2021-04-09 ENCOUNTER — Other Ambulatory Visit: Payer: Self-pay | Admitting: Cardiology

## 2021-04-09 DIAGNOSIS — E785 Hyperlipidemia, unspecified: Secondary | ICD-10-CM

## 2021-04-09 DIAGNOSIS — I10 Essential (primary) hypertension: Secondary | ICD-10-CM | POA: Diagnosis not present

## 2021-04-16 DIAGNOSIS — L821 Other seborrheic keratosis: Secondary | ICD-10-CM | POA: Diagnosis not present

## 2021-04-16 DIAGNOSIS — D225 Melanocytic nevi of trunk: Secondary | ICD-10-CM | POA: Diagnosis not present

## 2021-04-16 DIAGNOSIS — Z86018 Personal history of other benign neoplasm: Secondary | ICD-10-CM | POA: Diagnosis not present

## 2021-04-16 DIAGNOSIS — D1724 Benign lipomatous neoplasm of skin and subcutaneous tissue of left leg: Secondary | ICD-10-CM | POA: Diagnosis not present

## 2021-04-16 DIAGNOSIS — Z85828 Personal history of other malignant neoplasm of skin: Secondary | ICD-10-CM | POA: Diagnosis not present

## 2021-04-16 DIAGNOSIS — D2272 Melanocytic nevi of left lower limb, including hip: Secondary | ICD-10-CM | POA: Diagnosis not present

## 2021-04-16 DIAGNOSIS — D2261 Melanocytic nevi of right upper limb, including shoulder: Secondary | ICD-10-CM | POA: Diagnosis not present

## 2021-04-16 DIAGNOSIS — D485 Neoplasm of uncertain behavior of skin: Secondary | ICD-10-CM | POA: Diagnosis not present

## 2021-04-16 DIAGNOSIS — L570 Actinic keratosis: Secondary | ICD-10-CM | POA: Diagnosis not present

## 2021-04-16 DIAGNOSIS — L578 Other skin changes due to chronic exposure to nonionizing radiation: Secondary | ICD-10-CM | POA: Diagnosis not present

## 2021-04-16 DIAGNOSIS — D2262 Melanocytic nevi of left upper limb, including shoulder: Secondary | ICD-10-CM | POA: Diagnosis not present

## 2021-04-22 ENCOUNTER — Ambulatory Visit (INDEPENDENT_AMBULATORY_CARE_PROVIDER_SITE_OTHER): Payer: Medicare Other | Admitting: Gastroenterology

## 2021-04-22 ENCOUNTER — Encounter: Payer: Self-pay | Admitting: Gastroenterology

## 2021-04-22 VITALS — BP 150/70 | HR 56 | Ht 67.0 in | Wt 191.0 lb

## 2021-04-22 DIAGNOSIS — K649 Unspecified hemorrhoids: Secondary | ICD-10-CM

## 2021-04-22 DIAGNOSIS — K625 Hemorrhage of anus and rectum: Secondary | ICD-10-CM | POA: Diagnosis not present

## 2021-04-22 DIAGNOSIS — K602 Anal fissure, unspecified: Secondary | ICD-10-CM | POA: Diagnosis not present

## 2021-04-22 MED ORDER — CALMOL-4 76-10 % RE SUPP: RECTAL | 0 refills | Status: DC

## 2021-04-22 MED ORDER — AMBULATORY NON FORMULARY MEDICATION: 1 refills | Status: DC

## 2021-04-22 NOTE — Progress Notes (Signed)
HPI :  70 year old male here for follow-up visit.  I last saw him in October 2020.  He has a history of anal fissures in the past as well as internal hemorrhoids.  His main complaint today is rectal bleeding.  He has had some blood noted on the toilet paper occasionally a few months ago.  This past weekend he had significantly more blood noted on both the toilet paper and in the toilet bowl, multiple episodes.  He had some discomfort in his rectum but not any overt or severe pain at that time.  He was taking a fiber supplement and has 1-2 bowel movements in the morning.  He generally takes Metamucil and thinks this typically works pretty well for him.  He has not tried any topical ointments more recently for his symptoms.  He feels like this is superficial bleeding but he is not sure if this is due to anal fissure or hemorrhoids  He has had a colonoscopy in 2018 which did not show any polyps, but remarkable for anal fissure and internal hemorrhoids.  He does have diverticulosis.     Prior evaluation:  EGD 08/29/2017 - 3cm HH, mild esophagitis, 53mm inflammatory nodule - biopsies benign Colonoscopy 08/29/2017 - anal fissures, posterior anal canal, moderate internal hemorrhoids, diverticulosis   Colonoscopy 01/25/2012 - normal exam, biopsies ruled out microscopic colitis, told to repeat in 10 years EGD 01/25/2012 - mild esophagitis, no evidence of Barrett's   CT scan 04/05/2016 - IMPRESSION: 1. Mild sigmoid diverticulosis. Negative for diverticulitis. No bowel mass or obstruction 2. Prostate enlargement 3. Atherosclerotic disease 4. Liver cyst slightly larger but appears benign     Past Medical History:  Diagnosis Date   Allergy    Anal fissure    Cancer ([place])    basal cell CA- forehead   DDD (degenerative disc disease), cervical    Hemorrhoids    Hernia, inguinal    Hiatal hernia    IBS (irritable bowel syndrome)    Ulcer      Past Surgical History:  Procedure Laterality Date    FINGER SURGERY     Left Pinky finger   INGUINAL HERNIA REPAIR     bilateral   MOHS SURGERY     top of his head   NASAL SEPTUM SURGERY     right knee surgery     TONSILLECTOMY AND ADENOIDECTOMY     TONSILLECTOMY AND ADENOIDECTOMY     Family History  Problem Relation Age of Onset   Heart disease Father    Heart disease Brother    Colon cancer Neg Hx    Esophageal cancer Neg Hx    Rectal cancer Neg Hx    Stomach cancer Neg Hx    Social History   Tobacco Use   Smoking status: Former    Packs/day: 1.50    Years: 15.00    Pack years: 22.50    Types: Cigarettes    Quit date: 10/11/1980    Years since quitting: 40.5   Smokeless tobacco: Never  Vaping Use   Vaping Use: Never used  Substance Use Topics   Alcohol use: Yes    Comment: 2-3 drinks per MONTH   Drug use: No   Current Outpatient Medications  Medication Sig Dispense Refill   atenolol (TENORMIN) 50 MG tablet TAKE 1 TABLET BY MOUTH EVERY EVENING 90 tablet 3   hydrALAZINE (APRESOLINE) 25 MG tablet TAKE 1 TABLET BY MOUTH THREE TIMES A DAY 270 tablet 3   omeprazole (PRILOSEC)  20 MG capsule Take 20 mg by mouth. OTC Mon, Wed, Frid     rosuvastatin (CRESTOR) 10 MG tablet TAKE 1 TABLET BY MOUTH EVERY DAY 90 tablet 1   terazosin (HYTRIN) 5 MG capsule Take 1 capsule (5 mg total) by mouth at bedtime. 90 capsule 3   valsartan-hydrochlorothiazide (DIOVAN-HCT) 320-12.5 MG tablet Take 0.5 tablets by mouth daily.     No current facility-administered medications for this visit.   No Known Allergies   Review of Systems: All systems reviewed and negative except where noted in HPI.   Lab Results  Component Value Date   WBC 4.1 09/30/2020   HGB 13.5 09/30/2020   HCT 40.4 09/30/2020   MCV 92.0 09/30/2020   PLT 197 09/30/2020   Lab Results  Component Value Date   CREATININE 1.32 (H) 01/01/2021   BUN 23 01/01/2021   NA 141 01/01/2021   K 4.5 01/01/2021   CL 107 (H) 01/01/2021   CO2 21 01/01/2021    Lab Results  Component  Value Date   ALT 28 09/30/2020   AST 27 09/30/2020   ALKPHOS 65 09/30/2020   BILITOT 0.8 09/30/2020      Physical Exam: BP (!) 150/70   Pulse (!) 56   Ht 5\' 7"  (1.702 m)   Wt 191 lb (86.6 kg)   BMI 29.91 kg/m  Constitutional: Pleasant,well-developed, male in no acute distress. Abdominal: Soft, nondistended, nontender.  DRE - superficial anal fissure posterior midline fissure, inflamed internal hemorrhoid - full DRE not performed due to discomfort Neurological: Alert and oriented to person place and time. Psychiatric: Normal mood and affect. Behavior is normal.   ASSESSMENT AND PLAN: 70 year old male here for reassessment of the following  Anal fissure Internal hemorrhoids Rectal bleeding  As above patient with history of colonoscopy in the past for rectal bleeding thought to be due to anal fissures and internal hemorrhoids noted at that time, treated with topical ointments and fiber had improvement.  He has had some recurrence of symptoms recently despite taking fiber supplement.  While he does not endorse much pain he did have some discomfort on DRE today.  He has a small anal fissure and an inflamed internal hemorrhoid, I think the hemorrhoid may more likely be the cause of his symptoms but will need to treat both.  Recommend topical diltiazem ointment with lidocaine PR 3 times daily to treat fissure.  Regarding his internal hemorrhoid inflammation, we will hold off on on topical steroids right now in light of the fissure, we will treat that with Camel suppository daily to twice daily.  He will continue fiber supplement.  Hopefully he improves with conservative therapy, if not and symptoms persist over the next few weeks he can contact me for reassessment.  His colonoscopy is otherwise up-to-date.  Plan: - Diltiazem ointment with lidocaine - [facility] - pea sized amount TID until healed - continue fiber supplement  - start Calmol suppository daily to BID - follow up  if no improvement over the next 2 weeks  Gambrills Cellar, MD [facility]  CC: Adria Dill Le[place] Reader, [place]

## 2021-04-22 NOTE — Patient Instructions (Addendum)
If you are age 70 or older, your body mass index should be between 23-30. Your Body mass index is 29.91 kg/m. If this is out of the aforementioned range listed, please consider follow up with your Primary Care Provider.  If you are age 21 or younger, your body mass index should be between 19-25. Your Body mass index is 29.91 kg/m. If this is out of the aformentioned range listed, please consider follow up with your Primary Care Provider.   __________________________________________________________  The [facility] providers would like to encourage you to use [facility] to communicate with providers for non-urgent requests or questions.  Due to long hold times on the telephone, sending your provider a message by [facility] may be a faster and more efficient way to get a response.  Please allow 48 business hours for a response.  Please remember that this is for non-urgent requests.   We have sent a prescription for Diltiazem ointment with Lidocaine to [facility]. You should apply a pea size amount to your rectum three times daily for several weeks until healed  [facility] information is below: Address: [address] [place], [place] [address]  [phone]  *Please DO NOT go directly from our office to pick up this medication! Give the pharmacy 1 day to process the prescription as this is compounded and takes time to make.   Continue fiber  We are giving you some Calmol Suppository samples today.  Use once to twice daily. You can purchase these over the counter if you like them. Don't use Anusol for now.  Please let us know how you're doing in a few weeks.  Thank you for entrusting me with your care and for choosing [facility], Dr. Crowley Cellar

## 2021-05-04 NOTE — Telephone Encounter (Signed)
From pt

## 2021-05-13 ENCOUNTER — Telehealth: Payer: Self-pay | Admitting: Gastroenterology

## 2021-05-13 NOTE — Telephone Encounter (Signed)
Patient called said he was given Camol 4 but he was not able to find it anywhere but he did find something else but he has questions concerning it causing HBP.

## 2021-05-14 NOTE — Telephone Encounter (Signed)
Spoke with patient, he states that he was trying to find the Calmol suppositories yesterday evening and could not find them. He states that he was going to get an alternative but was not sure if that would cause any BP issues for him. Patient states that everything is fine now because he ended up finding the Calmol suppositories. Patient had no concerns at the end of the call.

## 2021-05-15 DIAGNOSIS — I10 Essential (primary) hypertension: Secondary | ICD-10-CM | POA: Diagnosis not present

## 2021-06-09 DIAGNOSIS — U071 COVID-19: Secondary | ICD-10-CM | POA: Diagnosis not present

## 2021-06-15 DIAGNOSIS — I10 Essential (primary) hypertension: Secondary | ICD-10-CM | POA: Diagnosis not present

## 2021-06-21 ENCOUNTER — Other Ambulatory Visit: Payer: Self-pay | Admitting: Cardiology

## 2021-06-21 DIAGNOSIS — N401 Enlarged prostate with lower urinary tract symptoms: Secondary | ICD-10-CM

## 2021-07-15 DIAGNOSIS — I10 Essential (primary) hypertension: Secondary | ICD-10-CM | POA: Diagnosis not present

## 2021-07-20 ENCOUNTER — Encounter: Payer: Self-pay | Admitting: Student

## 2021-07-20 ENCOUNTER — Ambulatory Visit: Payer: Medicare Other | Admitting: Student

## 2021-07-20 ENCOUNTER — Telehealth: Payer: Self-pay

## 2021-07-20 ENCOUNTER — Other Ambulatory Visit: Payer: Self-pay

## 2021-07-20 VITALS — BP 182/82 | HR 48 | Temp 97.8°F | Ht 67.0 in | Wt 194.0 lb

## 2021-07-20 DIAGNOSIS — R072 Precordial pain: Secondary | ICD-10-CM | POA: Diagnosis not present

## 2021-07-20 DIAGNOSIS — R0609 Other forms of dyspnea: Secondary | ICD-10-CM | POA: Diagnosis not present

## 2021-07-20 DIAGNOSIS — I10 Essential (primary) hypertension: Secondary | ICD-10-CM | POA: Diagnosis not present

## 2021-07-20 MED ORDER — HYDRALAZINE HCL 50 MG PO TABS: 50.0000 mg | ORAL_TABLET | Freq: Three times a day (TID) | ORAL | 3 refills | Status: DC

## 2021-07-20 NOTE — Telephone Encounter (Signed)
Patient called and stated that he has not been able to sleep for the past few nights due to chest discomfort. I have transferred call to front desk staff ([phone]) to schedule appointment to be seen today with [facility].

## 2021-07-20 NOTE — Progress Notes (Signed)
Primary Physician/Referring:  Holland Commons, FNP  Patient ID: John Bird, male    DOB: Aug 02, 1951, 70 y.o.   MRN: [id number]  Chief Complaint  Patient presents with   Chest Pain   Follow-up    HPI: John Bird  is a 70 y.o. male Caucasian  male  with chronic asymptomatic sinus bradycardia, hyperlipidemia,  prior 10-15-pack-year  cigarette use, quit remotely,  small 3 cm AAA, family history of premature coronary artery disease in his brother having MI at age 26Y, chronic stable angina and DOE with nuclear stress test on 09/17/2018 revealing mild inferior ischemia.  Patient has had 2 episodes of syncope in late 2021 and 11/2020 suggestive of vasovagal syncope.  He has had no recurrence of syncope or near syncope since this episode.  Patient presents for urgent visit with complaints of chest discomfort.  Patient states over the last 4 nights he has had difficulty sleeping.  States he wakes up with chest and abdominal discomfort lasting 3 to 4 hours until he is able to fall back asleep.  Denies associated symptoms.  Denies shortness of breath, palpitations, syncope, near syncope, dizziness, radiation of pain.   Patient denies exertional symptoms.  He also reports history of GERD.  He is presently enrolled in our RPM and PCM program.  Patient has a history of making medication adjustments without consulting provider, particularly in regards to antihypertensive medications.  Presently he is taking valsartan/hydrochlorothiazide half tablet once daily, atenolol 50 mg each evening and hydralazine 25 mg in the morning and 50 mg in the evening.  Pressure remains uncontrolled.  Past Medical History:  Diagnosis Date   Allergy    Anal fissure    Cancer ([place])    basal cell CA- forehead   DDD (degenerative disc disease), cervical    Hemorrhoids    Hernia, inguinal    Hiatal hernia    IBS (irritable bowel syndrome)    Ulcer    Past Surgical History:  Procedure Laterality Date   FINGER  SURGERY     Left Pinky finger   INGUINAL HERNIA REPAIR     bilateral   MOHS SURGERY     top of his head   NASAL SEPTUM SURGERY     right knee surgery     TONSILLECTOMY AND ADENOIDECTOMY     TONSILLECTOMY AND ADENOIDECTOMY     Family History  Problem Relation Age of Onset   Heart disease Father    Heart disease Brother    Colon cancer Neg Hx    Esophageal cancer Neg Hx    Rectal cancer Neg Hx    Stomach cancer Neg Hx    Social History   Tobacco Use   Smoking status: Former    Packs/day: 1.50    Years: 15.00    Pack years: 22.50    Types: Cigarettes    Quit date: 10/11/1980    Years since quitting: 40.8   Smokeless tobacco: Never  Substance Use Topics   Alcohol use: Yes    Comment: 2-3 drinks per MONTH   Marital Status: Married   Current Outpatient Medications on File Prior to Visit  Medication Sig Dispense Refill   AMBULATORY NON FORMULARY MEDICATION Medication Name: Diltiazem 2% with Lidocaine Use a pea-sized amount up to the first knuckle into the rectum three times a day for 4 to 6 weeks 30 g 1   atenolol (TENORMIN) 50 MG tablet TAKE 1 TABLET BY MOUTH EVERY EVENING 90 tablet 3  omeprazole (PRILOSEC) 20 MG capsule Take 20 mg by mouth. OTC Mon, Wed, Frid     Rectal Protectant-Emollient (CALMOL-4) 76-10 % SUPP Use as directed once to twice daily 6 suppository 0   rosuvastatin (CRESTOR) 10 MG tablet TAKE 1 TABLET BY MOUTH EVERY DAY (Patient taking differently: Take 10 mg by mouth. mwf) 90 tablet 1   terazosin (HYTRIN) 5 MG capsule TAKE 1 CAPSULE (5 MG TOTAL) BY MOUTH AT BEDTIME. (Patient taking differently: Take 5 mg by mouth. mwf) 90 capsule 0   valsartan-hydrochlorothiazide (DIOVAN-HCT) 320-12.5 MG tablet Take 0.5 tablets by mouth daily.     No current facility-administered medications on file prior to visit.   Review of Systems  Constitutional: Negative for malaise/fatigue and weight gain.  Cardiovascular:  Positive for chest pain. Negative for claudication, leg  swelling, near-syncope, orthopnea, palpitations, paroxysmal nocturnal dyspnea and syncope.  Respiratory:  Negative for shortness of breath.   Gastrointestinal:  Positive for abdominal pain.  Neurological:  Negative for dizziness.    Vitals with BMI 07/20/2021 07/20/2021 04/22/2021  Height - 5' 7"  5' 7"   Weight - 194 lbs 191 lbs  BMI - 58.09 98.33  Systolic 825 053 976  Diastolic 82 83 70  Pulse 48 48 56    Physical Exam Vitals reviewed.  Constitutional:      Appearance: He is well-developed. He is obese.     Comments: Mildly obese  Neck:     Thyroid: No thyromegaly.     Vascular: No JVD.  Cardiovascular:     Rate and Rhythm: Regular rhythm. Bradycardia present.     Pulses: Normal pulses and intact distal pulses.     Heart sounds: Normal heart sounds. No murmur heard.   No gallop.  Pulmonary:     Effort: Pulmonary effort is normal.     Breath sounds: Normal breath sounds.  Musculoskeletal:     Right lower leg: No edema.     Left lower leg: No edema.   Laboratory examination:   CMP Latest Ref Rng & Units 01/01/2021 09/30/2020 05/22/2020  Glucose 65 - 99 mg/dL 97 109(H) 138(H)  BUN 8 - 27 mg/dL 23 18 22   Creatinine 0.76 - 1.27 mg/dL 1.32(H) 1.21 1.38(H)  Sodium 134 - 144 mmol/L 141 136 142  Potassium 3.5 - 5.2 mmol/L 4.5 4.1 4.2  Chloride 96 - 106 mmol/L 107(H) 105 108(H)  CO2 20 - 29 mmol/L 21 23 20   Calcium 8.6 - 10.2 mg/dL 8.6 8.4(L) 8.4(L)  Total Protein 6.5 - 8.1 g/dL - 7.0 -  Total Bilirubin 0.3 - 1.2 mg/dL - 0.8 -  Alkaline Phos 38 - 126 U/L - 65 -  AST 15 - 41 U/L - 27 -  ALT 0 - 44 U/L - 28 -   CBC Latest Ref Rng & Units 09/30/2020 08/07/2019  WBC 4.0 - 10.5 K/uL 4.1 7.9  Hemoglobin 13.0 - 17.0 g/dL 13.5 14.0  Hematocrit 39.0 - 52.0 % 40.4 41.8  Platelets 150 - 400 K/uL 197 239.0   Lipid Panel     Component Value Date/Time   CHOL 133 12/26/2018 0958   TRIG 67 12/26/2018 0958   HDL 41 12/26/2018 0958   LDLCALC 79 12/26/2018 0958   External labs:    12/06/2020:  Hb 11.3/HCT 40.4, platelets 201, normal indicis CMP otherwise normal.  Sodium 127, potassium 4.0, serum glucose 149 mg.  121, creatinine 1.6. Total cholesterol 98, triglycerides 211, HDL 38, LDL 70.  09/26/2018:  Cholesterol 156, triglycerides 67, HDL 44,  LDL 99.  Creatinine 1.2, EGFR 62/72, potassium 4.4, CMP normal.  07/24/2018:  Potassium 4.3, BUN 17, creatinine 1.2.  EGFR 60/69 mL, CMP otherwise normal.  HB 13.6/HCT 40.2, platelets 227.  Total cholesterol 181, triglycerides 81, HDL 44, LDL 120.  Radiology   No results found.  Cardiac Studies:   Echocardiogram 09/20/2018: 1. Left ventricle cavity is normal in size. Mild concentric hypertrophy of the left ventricle. Normal global wall motion. Doppler evidence of grade II (pseudonormal) diastolic dysfunction, elevated LAP. Calculated EF 66%. 2. Left atrial cavity is mildly dilated. 3. Trace aortic regurgitation. 4. Mild (Grade I) mitral regurgitation. 5. Trace tricuspid regurgitation.  Lexiscan Sestamibi stress test 09/15/2018: 1. Lexiscan stress test was performed. Exercise capacity was not assessed. Stress symptoms included chest pressure, dyspnea, syncope. Resting blood pressure 138/82 mmHg, dropped to peak effect blood pressure of 82/40 mmHg. The resting and stress electrocardiogram demonstrated normal sinus rhythm, normal resting conduction, no resting arrhythmias and normal rest repolarization. Stress EKG is non diagnostic for ischemia as it is a pharmacologic stress. 2. The overall quality of the study is good. Left ventricular cavity is noted to be normal on the rest and stress studies. Gated SPECT images reveal normal myocardial thickening and wall motion. The left ventricular ejection fraction was calculated or visually estimated to be 68%. SPECT images reveal small size, mild intensity, reversible perfusion defect in basal inferior myocardium suggestive of ischemia. 3. Low risk study.  Abdominal aortic duplex  09/19/2018: Moderate dilatation of the abdominal aorta is noted in the distal aorta. Mild heterogeneous plaque noted. Normal flow velocities noted. A distal abdominal aortic aneurysm measuring 3.14 x 2.42 x 1.12 cm is seen. Recheck in 1 year.    EKG   07/20/2021: Sinus bradycardia at a rate of 45 bpm.  Normal axis.  No evidence of ischemia or underlying injury pattern.  EKG 04/21/2020: Marked sinus bradycardia 48 bpm, leftward axis, no evidence of ischemia. No significant change from EKG 09/14/2019: Marked sinus bradycardia at rate of 44 bpm.   Assessment     ICD-10-CM   1. Precordial pain  R07.2 EKG 12-Lead    PCV ECHOCARDIOGRAM COMPLETE    Basic metabolic panel    Brain natriuretic peptide    2. Essential (primary) hypertension  I10 hydrALAZINE (APRESOLINE) 50 MG tablet    3. Dyspnea on exertion  R06.09 PCV ECHOCARDIOGRAM COMPLETE    Basic metabolic panel    Brain natriuretic peptide      Medications Discontinued During This Encounter  Medication Reason   hydrALAZINE (APRESOLINE) 25 MG tablet Reorder    Meds ordered this encounter  Medications   hydrALAZINE (APRESOLINE) 50 MG tablet    Sig: Take 1 tablet (50 mg total) by mouth 3 (three) times daily.    Dispense:  90 tablet    Refill:  3    DX Code Needed  .   Recommendations:   MAYJOR AGER  is a 70 y.o. Caucasian  male  with chronic asymptomatic sinus bradycardia, hyperlipidemia,  prior 10-15-pack-year  cigarette use, quit remotely,  small 3 cm AAA, family history of premature coronary artery disease in his brother having MI at age 53Y, chronic stable angina and DOE with nuclear stress test on 09/17/2018 revealing mild inferior ischemia.  Patient has had 2 episodes of syncope in late 2021 and 11/2020 suggestive of vasovagal syncope.  He has had no recurrence of syncope or near syncope since this episode.  Patient presents for urgent visit with complaints of chest  discomfort.  EKG is without ischemic changes.  Patient's  symptoms are atypical at best.  However given symptoms and multiple cardiovascular risk factors will obtain BMP and BNP as well as echocardiogram.  Discussed with patient option to proceed with repeat ischemic evaluation, however he preferred to hold off for now and reevaluate at follow-up.  Patient's blood pressure is quite elevated today which may be contributing to chest discomfort.  Patient is hesitant to make changes to antihypertensive medications.  Shared decision was to increase hydralazine to 50 mg 3 times daily, patient is agreeable.  We will continue to monitor remotely.   Suspect patient's symptoms may be related to underlying GI pathology including GERD.  Recommend he also follow-up with PCP for further evaluation.  Patient has had no recurrence of syncope or near syncope, could consider long-term EKG monitoring versus loop recorder implantation if he has recurrence of these episodes.  Follow-up in 6 weeks, sooner if needed, for precordial pain and results of cardiac testing.   Alethia Berthold, PA-C 07/20/2021, 11:05 AM Office: [phone]

## 2021-07-21 DIAGNOSIS — R072 Precordial pain: Secondary | ICD-10-CM | POA: Diagnosis not present

## 2021-07-21 DIAGNOSIS — R0609 Other forms of dyspnea: Secondary | ICD-10-CM | POA: Diagnosis not present

## 2021-07-22 ENCOUNTER — Telehealth: Payer: Self-pay

## 2021-07-22 LAB — BASIC METABOLIC PANEL
BUN/Creatinine Ratio: 15 (ref 10–24)
BUN: 20 mg/dL (ref 8–27)
CO2: 25 mmol/L (ref 20–29)
Calcium: 8.8 mg/dL (ref 8.6–10.2)
Chloride: 104 mmol/L (ref 96–106)
Creatinine, Ser: 1.3 mg/dL — ABNORMAL HIGH (ref 0.76–1.27)
Glucose: 89 mg/dL (ref 70–99)
Potassium: 4.7 mmol/L (ref 3.5–5.2)
Sodium: 140 mmol/L (ref 134–144)
eGFR: 59 mL/min/{1.73_m2} — ABNORMAL LOW (ref >59)

## 2021-07-22 LAB — BRAIN NATRIURETIC PEPTIDE: BNP: 76 pg/mL (ref 0.0–100.0)

## 2021-07-22 NOTE — Telephone Encounter (Signed)
Patient asked that you call him at some point

## 2021-07-22 NOTE — Telephone Encounter (Signed)
Patient wanted to relay home blood pressure readings of 459 mmHg systolic last night and 977/41 mmHg this morning.  He denies dizziness, lightheadedness, shortness of breath, fatigue, syncope, near syncope.  Advised patient I am pleased with these blood pressure readings.  He can continue current regimen and continue to monitor his blood pressure at home.

## 2021-08-03 ENCOUNTER — Ambulatory Visit: Payer: Medicare Other

## 2021-08-03 ENCOUNTER — Other Ambulatory Visit: Payer: Self-pay

## 2021-08-03 DIAGNOSIS — R072 Precordial pain: Secondary | ICD-10-CM | POA: Diagnosis not present

## 2021-08-03 DIAGNOSIS — R0609 Other forms of dyspnea: Secondary | ICD-10-CM

## 2021-08-07 NOTE — Telephone Encounter (Signed)
From patient.

## 2021-08-10 ENCOUNTER — Encounter: Payer: Self-pay | Admitting: Pharmacist

## 2021-08-10 NOTE — Progress Notes (Signed)
CARE PLAN ENTRY  08/10/2021 Name: John Bird MRN: [id number] DOB: 01-13-1951  John Bird is enrolled in Remote Patient Monitoring/Principle Care Monitoring.  Date of Enrollment: 07/30/20 Supervising physician: Adrian Prows Indication: HTN  Remote Readings: Compliant and Avg BP: 150/73, HR:51  Next scheduled OV: 09/14/21  Pharmacist Clinical Goal(s):  Over the next 90 days, patient will demonstrate Improved medication adherence as evidenced by medication fill history Over the next 90 days, patient will demonstrate improved understanding of prescribed medications and rationale for usage as evidenced by patient teach back Over the next 90 days, patient will experience decrease in ED visits. ED visits in last 6 months = 0 Over the next 90 days, patient will not experience hospital admission. Hospital Admissions in last 6 months = 0  Interventions: Provider and Inter-disciplinary care team collaboration (see longitudinal plan of care) Comprehensive medication review performed. Discussed plans with patient for ongoing care management follow up and provided patient with direct contact information for care management team Collaboration with provider re: medication management  Patient Self Care Activities:  Self administers medications as prescribed Attends all scheduled provider appointments Performs ADL's independently Performs IADL's independently  No Known Allergies Outpatient Encounter Medications as of 08/10/2021  Medication Sig   AMBULATORY NON FORMULARY MEDICATION Medication Name: Diltiazem 2% with Lidocaine Use a pea-sized amount up to the first knuckle into the rectum three times a day for 4 to 6 weeks   atenolol (TENORMIN) 50 MG tablet TAKE 1 TABLET BY MOUTH EVERY EVENING   hydrALAZINE (APRESOLINE) 50 MG tablet Take 1 tablet (50 mg total) by mouth 3 (three) times daily.   omeprazole (PRILOSEC) 20 MG capsule Take 20 mg by mouth. OTC Mon, Wed, Frid   Rectal  Protectant-Emollient (CALMOL-4) 76-10 % SUPP Use as directed once to twice daily   rosuvastatin (CRESTOR) 10 MG tablet TAKE 1 TABLET BY MOUTH EVERY DAY (Patient taking differently: Take 10 mg by mouth. mwf)   terazosin (HYTRIN) 5 MG capsule TAKE 1 CAPSULE (5 MG TOTAL) BY MOUTH AT BEDTIME. (Patient taking differently: Take 5 mg by mouth. mwf)   valsartan-hydrochlorothiazide (DIOVAN-HCT) 320-12.5 MG tablet Take 0.5 tablets by mouth daily.   No facility-administered encounter medications on file as of 08/10/2021.    Hypertension   BP goal is:  <130/80  Office blood pressures are  BP Readings from Last 3 Encounters:  07/20/21 (!) 182/82  04/22/21 (!) 150/70  12/16/20 (!) 158/79    Patient is currently uncontrolled on the following medications: Hydralazine 50 mg TID, terazosin 5 mg, valsartan-HCTZ 160/6.25 mg, atenolol 50 mg  Patient checks BP at home twice daily  Patient home BP readings are ranging: 118-178/54-90   We discussed diet and exercise extensively  Plan  Continue current medications and control with diet and exercise  ______________ Visit Information SDOH (Social Determinants of Health) assessments performed: Yes.  John Bird was given information about Principle Care Management/Remote Patient Monitoring services today including:  RPM/PCM service includes personalized support from designated clinical staff supervised by his physician, including individualized plan of care and coordination with other care providers 24/7 contact phone numbers for assistance for urgent and routine care needs. Standard insurance, coinsurance, copays and deductibles apply for principle care management only during months in which we provide at least 30 minutes of these services. Most insurances cover these services at 100%, however patients may be responsible for any copay, coinsurance and/or deductible if applicable. This service may help you avoid the need for more expensive  face-to-face  services. Only one practitioner may furnish and bill the service in a calendar month. The patient may stop PCM/RPM services at any time (effective at the end of the month) by phone call to the office staff.  Patient agreed to services and verbal consent obtained.   Manuela Schwartz, Pharm.D. [facility] [phone] Ext: 120

## 2021-08-15 DIAGNOSIS — I10 Essential (primary) hypertension: Secondary | ICD-10-CM | POA: Diagnosis not present

## 2021-08-30 ENCOUNTER — Other Ambulatory Visit: Payer: Self-pay | Admitting: Cardiology

## 2021-08-30 DIAGNOSIS — I10 Essential (primary) hypertension: Secondary | ICD-10-CM

## 2021-09-14 ENCOUNTER — Encounter: Payer: Self-pay | Admitting: Cardiology

## 2021-09-14 ENCOUNTER — Ambulatory Visit: Payer: Medicare Other | Admitting: Cardiology

## 2021-09-14 ENCOUNTER — Other Ambulatory Visit: Payer: Self-pay

## 2021-09-14 VITALS — BP 136/74 | HR 53 | Temp 98.0°F | Resp 17 | Ht 67.0 in | Wt 198.0 lb

## 2021-09-14 DIAGNOSIS — I77819 Aortic ectasia, unspecified site: Secondary | ICD-10-CM

## 2021-09-14 DIAGNOSIS — I10 Essential (primary) hypertension: Secondary | ICD-10-CM

## 2021-09-14 DIAGNOSIS — N1831 Chronic kidney disease, stage 3a: Secondary | ICD-10-CM | POA: Diagnosis not present

## 2021-09-14 DIAGNOSIS — I129 Hypertensive chronic kidney disease with stage 1 through stage 4 chronic kidney disease, or unspecified chronic kidney disease: Secondary | ICD-10-CM | POA: Diagnosis not present

## 2021-09-14 MED ORDER — NIFEDIPINE ER OSMOTIC RELEASE 60 MG PO TB24: 60.0000 mg | ORAL_TABLET | Freq: Every day | ORAL | 2 refills | Status: DC

## 2021-09-14 NOTE — Progress Notes (Signed)
Primary Physician/Referring:  Holland Commons, FNP  Patient ID: John Bird, male    DOB: 1950-10-20, 70 y.o.   MRN: [id number]  Chief Complaint  Patient presents with  . Essential (primary) hypertension    HPI: John Bird  is a 70 y.o. male Caucasian  male  with chronic asymptomatic sinus bradycardia, hyperlipidemia,  prior 10-15-pack-year  cigarette use, quit remotely, mild aortic ectasia, family history of premature coronary artery disease in his brother having MI at age 93Y, chronic stable angina and DOE with nuclear stress test on 09/17/2018 revealing mild inferior ischemia.  On aggressive medical therapy has not had any recurrence of chest pain, dyspnea has remained stable.  His blood pressure control has been an issue, although much improved since he started seeing [place], it is still been high.  He is presently on RPM and CPM and average blood pressure has been systolic >169 mmHg.  No PND or orthopnea, no TIA.   Past Medical History:  Diagnosis Date  . Allergy   . Anal fissure   . Cancer (HCC)    basal cell CA- forehead  . DDD (degenerative disc disease), cervical   . Hemorrhoids   . Hernia, inguinal   . Hiatal hernia   . IBS (irritable bowel syndrome)   . Ulcer    Past Surgical History:  Procedure Laterality Date  . FINGER SURGERY     Left Pinky finger  . INGUINAL HERNIA REPAIR     bilateral  . MOHS SURGERY     top of his head  . NASAL SEPTUM SURGERY    . right knee surgery    . TONSILLECTOMY AND ADENOIDECTOMY    . TONSILLECTOMY AND ADENOIDECTOMY     Family History  Problem Relation Age of Onset  . Hypertension Mother   . Heart failure Mother 58       Passed at 63  . Heart disease Father   . Alzheimer's disease Father 70  . Heart disease Brother   . Colon cancer Neg Hx   . Esophageal cancer Neg Hx   . Rectal cancer Neg Hx   . Stomach cancer Neg Hx    Social History   Tobacco Use  . Smoking status: Former    Packs/day: 1.50    Years: 15.00     Pack years: 22.50    Types: Cigarettes    Quit date: 10/11/1980    Years since quitting: 40.9  . Smokeless tobacco: Never  Substance Use Topics  . Alcohol use: Yes    Comment: 2-3 drinks per MONTH   Marital Status: Married   Current Outpatient Medications on File Prior to Visit  Medication Sig Dispense Refill  . hydrALAZINE (APRESOLINE) 50 MG tablet Take 1 tablet (50 mg total) by mouth 3 (three) times daily. 90 tablet 3  . omeprazole (PRILOSEC) 20 MG capsule Take 20 mg by mouth. OTC Mon, Wed, Frid    . rosuvastatin (CRESTOR) 10 MG tablet TAKE 1 TABLET BY MOUTH EVERY DAY (Patient taking differently: Take 10 mg by mouth. mwf) 90 tablet 1  . terazosin (HYTRIN) 5 MG capsule TAKE 1 CAPSULE (5 MG TOTAL) BY MOUTH AT BEDTIME. (Patient taking differently: Take 5 mg by mouth. MON, WED, FRI) 90 capsule 0  . valsartan-hydrochlorothiazide (DIOVAN-HCT) 320-12.5 MG tablet Take 0.5 tablets by mouth daily.     No current facility-administered medications on file prior to visit.   Review of Systems  Cardiovascular:  Positive for dyspnea on exertion (stable).  Negative for chest pain and leg swelling.  Gastrointestinal:  Negative for melena.    Vitals with BMI 09/14/2021 09/14/2021 07/20/2021  Height - 5' 7"  -  Weight - 198 lbs -  BMI - 31 -  Systolic 323 557 322  Diastolic 74 66 82  Pulse - 53 48    Physical Exam Vitals reviewed.  Constitutional:      Appearance: He is well-developed. He is obese.     Comments: Mildly obese  Neck:     Thyroid: No thyromegaly.     Vascular: No JVD.  Cardiovascular:     Rate and Rhythm: Regular rhythm. Bradycardia present.     Pulses: Normal pulses and intact distal pulses.     Heart sounds: Normal heart sounds. No murmur heard.   No gallop.  Pulmonary:     Effort: Pulmonary effort is normal.     Breath sounds: Normal breath sounds.  Musculoskeletal:     Right lower leg: No edema.     Left lower leg: No edema.   Laboratory examination:   CMP Latest Ref  Rng & Units 07/21/2021 01/01/2021 09/30/2020  Glucose 70 - 99 mg/dL 89 97 109(H)  BUN 8 - 27 mg/dL 20 23 18   Creatinine 0.76 - 1.27 mg/dL 1.30(H) 1.32(H) 1.21  Sodium 134 - 144 mmol/L 140 141 136  Potassium 3.5 - 5.2 mmol/L 4.7 4.5 4.1  Chloride 96 - 106 mmol/L 104 107(H) 105  CO2 20 - 29 mmol/L 25 21 23   Calcium 8.6 - 10.2 mg/dL 8.8 8.6 8.4(L)  Total Protein 6.5 - 8.1 g/dL - - 7.0  Total Bilirubin 0.3 - 1.2 mg/dL - - 0.8  Alkaline Phos 38 - 126 U/L - - 65  AST 15 - 41 U/L - - 27  ALT 0 - 44 U/L - - 28   CBC Latest Ref Rng & Units 09/30/2020 08/07/2019  WBC 4.0 - 10.5 K/uL 4.1 7.9  Hemoglobin 13.0 - 17.0 g/dL 13.5 14.0  Hematocrit 39.0 - 52.0 % 40.4 41.8  Platelets 150 - 400 K/uL 197 239.0   Lipid Panel     Component Value Date/Time   CHOL 133 12/26/2018 0958   TRIG 67 12/26/2018 0958   HDL 41 12/26/2018 0958   LDLCALC 79 12/26/2018 0958   External labs:   12/06/2020:  Hb 11.3/HCT 40.4, platelets 201, normal indicis CMP otherwise normal.  Sodium 127, potassium 4.0, serum glucose 149 mg.  121, creatinine 1.6. Total cholesterol 98, triglycerides 211, HDL 38, LDL 70.  09/26/2018:  Cholesterol 156, triglycerides 67, HDL 44, LDL 99.  Creatinine 1.2, EGFR 62/72, potassium 4.4, CMP normal.  07/24/2018:  Potassium 4.3, BUN 17, creatinine 1.2.  EGFR 60/69 mL, CMP otherwise normal.  HB 13.6/HCT 40.2, platelets 227.  Total cholesterol 181, triglycerides 81, HDL 44, LDL 120.  Radiology   No results found.  Cardiac Studies:   Lexiscan Sestamibi stress test 09/15/2018: 1. Lexiscan stress test was performed. Exercise capacity was not assessed. Stress symptoms included chest pressure, dyspnea, syncope. Resting blood pressure 138/82 mmHg, dropped to peak effect blood pressure of 82/40 mmHg. The resting and stress electrocardiogram demonstrated normal sinus rhythm, normal resting conduction, no resting arrhythmias and normal rest repolarization. Stress EKG is non diagnostic for ischemia  as it is a pharmacologic stress. 2. The overall quality of the study is good. Left ventricular cavity is noted to be normal on the rest and stress studies. Gated SPECT images reveal normal myocardial thickening and wall motion. The left  ventricular ejection fraction was calculated or visually estimated to be 68%. SPECT images reveal small size, mild intensity, reversible perfusion defect in basal inferior myocardium suggestive of ischemia. 3. Low risk study.  Abdominal Aortic Duplex 09/12/2020: The maximum aorta (sac) diameter is 2.33 cm (dist) with minimal ectasia in the distal aorta. Moderate heterogeneous plaque observed in the proximal, mid and distal aorta.  Normal flow velocities noted in the iliac vessels proximally.  No AAA noted. Compared to 09/19/2018, 3 cm AAA not present and probably was a measurement error previously.  PCV ECHOCARDIOGRAM COMPLETE 08/03/2021  Narrative Echocardiogram 08/03/2021: Left ventricle cavity is normal in size and wall thickness. Normal global wall motion. Normal LV systolic function with EF 69%. Doppler evidence of grade I (impaired) diastolic dysfunction, normal LAP. Left atrial cavity is mildly dilated. Aneurysmal interatrial septum without 2D or color Doppler evidence of shunting. Structurally normal trileaflet aortic valve. No evidence of aortic stenosis. Mild (Grade I) aortic regurgitation. Estimated right atrial pressure 8 mmHg. Previous study in 2019 reported grade 2 DD, mild MR-not appreciated on this study.      EKG   07/20/2021: Sinus bradycardia at a rate of 45 bpm.  Normal axis.  No evidence of ischemia or underlying injury pattern.  EKG 04/21/2020: Marked sinus bradycardia 48 bpm, leftward axis, no evidence of ischemia. No significant change from EKG 09/14/2019: Marked sinus bradycardia at rate of 44 bpm.   Assessment     ICD-10-CM   1. Essential (primary) hypertension  I10 NIFEdipine (PROCARDIA XL) 60 MG 24 hr tablet    2. Stage 3a  chronic kidney disease (HCC)  N18.31     3. Aortic ectasia (HCC)  I77.819       Medications Discontinued During This Encounter  Medication Reason  . Rectal Protectant-Emollient (CALMOL-4) 76-10 % SUPP   . AMBULATORY NON FORMULARY MEDICATION   . atenolol (TENORMIN) 50 MG tablet Change in therapy    Meds ordered this encounter  Medications  . NIFEdipine (PROCARDIA XL) 60 MG 24 hr tablet    Sig: Take 1 tablet (60 mg total) by mouth daily.    Dispense:  30 tablet    Refill:  2    Stop Atenolol   Recommendations:   John Bird  is a 70 y.o. Caucasian  male  with chronic asymptomatic sinus bradycardia, hyperlipidemia,  prior 10-15-pack-year  cigarette use, quit remotely, and aortic ectasia,, family history of premature coronary artery disease in his brother having MI at age 74Y, mild chronic dyspnea presenting for follow-up of specifically hypertension.  Is presently asymptomatic with regard to hypertension except for mild chronic dyspnea.  No PND or orthopnea.  Blood pressure remains elevated, he does have underlying sinus bradycardia.  I am beginning to wonder whether bradycardia is leading to elevated systolic blood pressure due to arterial sclerosis in his age group.  I would like to try nifedipine ER 60 mg daily and will discontinue atenolol.  He will continue to follow with PCM and RPM for hypertension management.  Weight loss was discussed with the patient.  Office visit in a year or sooner if problems.  We will do AAA, repeat aortic duplex did not suggest AAA.  He has mild aortic ectasia.  We could consider repeating study in 5 to 10 years from now.  Renal function has remained stable at stage IIIa chronic kidney disease.   Adrian Prows, PA-C 09/14/2021, 10:48 AM Office: [phone]

## 2021-10-11 DIAGNOSIS — N4289 Other specified disorders of prostate: Secondary | ICD-10-CM

## 2021-10-11 HISTORY — DX: Other specified disorders of prostate: N42.89

## 2021-10-15 DIAGNOSIS — I10 Essential (primary) hypertension: Secondary | ICD-10-CM | POA: Diagnosis not present

## 2021-10-17 ENCOUNTER — Other Ambulatory Visit: Payer: Self-pay | Admitting: Student

## 2021-10-17 DIAGNOSIS — I10 Essential (primary) hypertension: Secondary | ICD-10-CM

## 2021-10-19 ENCOUNTER — Ambulatory Visit: Payer: PRIVATE HEALTH INSURANCE | Admitting: Cardiology

## 2021-10-21 DIAGNOSIS — Z125 Encounter for screening for malignant neoplasm of prostate: Secondary | ICD-10-CM | POA: Diagnosis not present

## 2021-10-21 DIAGNOSIS — Z0000 Encounter for general adult medical examination without abnormal findings: Secondary | ICD-10-CM | POA: Diagnosis not present

## 2021-10-21 DIAGNOSIS — I10 Essential (primary) hypertension: Secondary | ICD-10-CM | POA: Diagnosis not present

## 2021-10-21 DIAGNOSIS — E7801 Familial hypercholesterolemia: Secondary | ICD-10-CM | POA: Diagnosis not present

## 2021-10-28 DIAGNOSIS — K625 Hemorrhage of anus and rectum: Secondary | ICD-10-CM | POA: Diagnosis not present

## 2021-10-28 DIAGNOSIS — Z0000 Encounter for general adult medical examination without abnormal findings: Secondary | ICD-10-CM | POA: Diagnosis not present

## 2021-10-28 DIAGNOSIS — I10 Essential (primary) hypertension: Secondary | ICD-10-CM | POA: Diagnosis not present

## 2021-10-28 DIAGNOSIS — I714 Abdominal aortic aneurysm, without rupture, unspecified: Secondary | ICD-10-CM | POA: Diagnosis not present

## 2021-10-28 DIAGNOSIS — E7800 Pure hypercholesterolemia, unspecified: Secondary | ICD-10-CM | POA: Diagnosis not present

## 2021-10-28 DIAGNOSIS — K219 Gastro-esophageal reflux disease without esophagitis: Secondary | ICD-10-CM | POA: Diagnosis not present

## 2021-11-13 IMAGING — DX DG CHEST 1V PORT
1 series · 1 of 1 positions shown · non-contrast
Comparison: 01/25/2010

CLINICAL DATA: Chest pain

EXAM:
PORTABLE CHEST 1 VIEW

[chest ap]
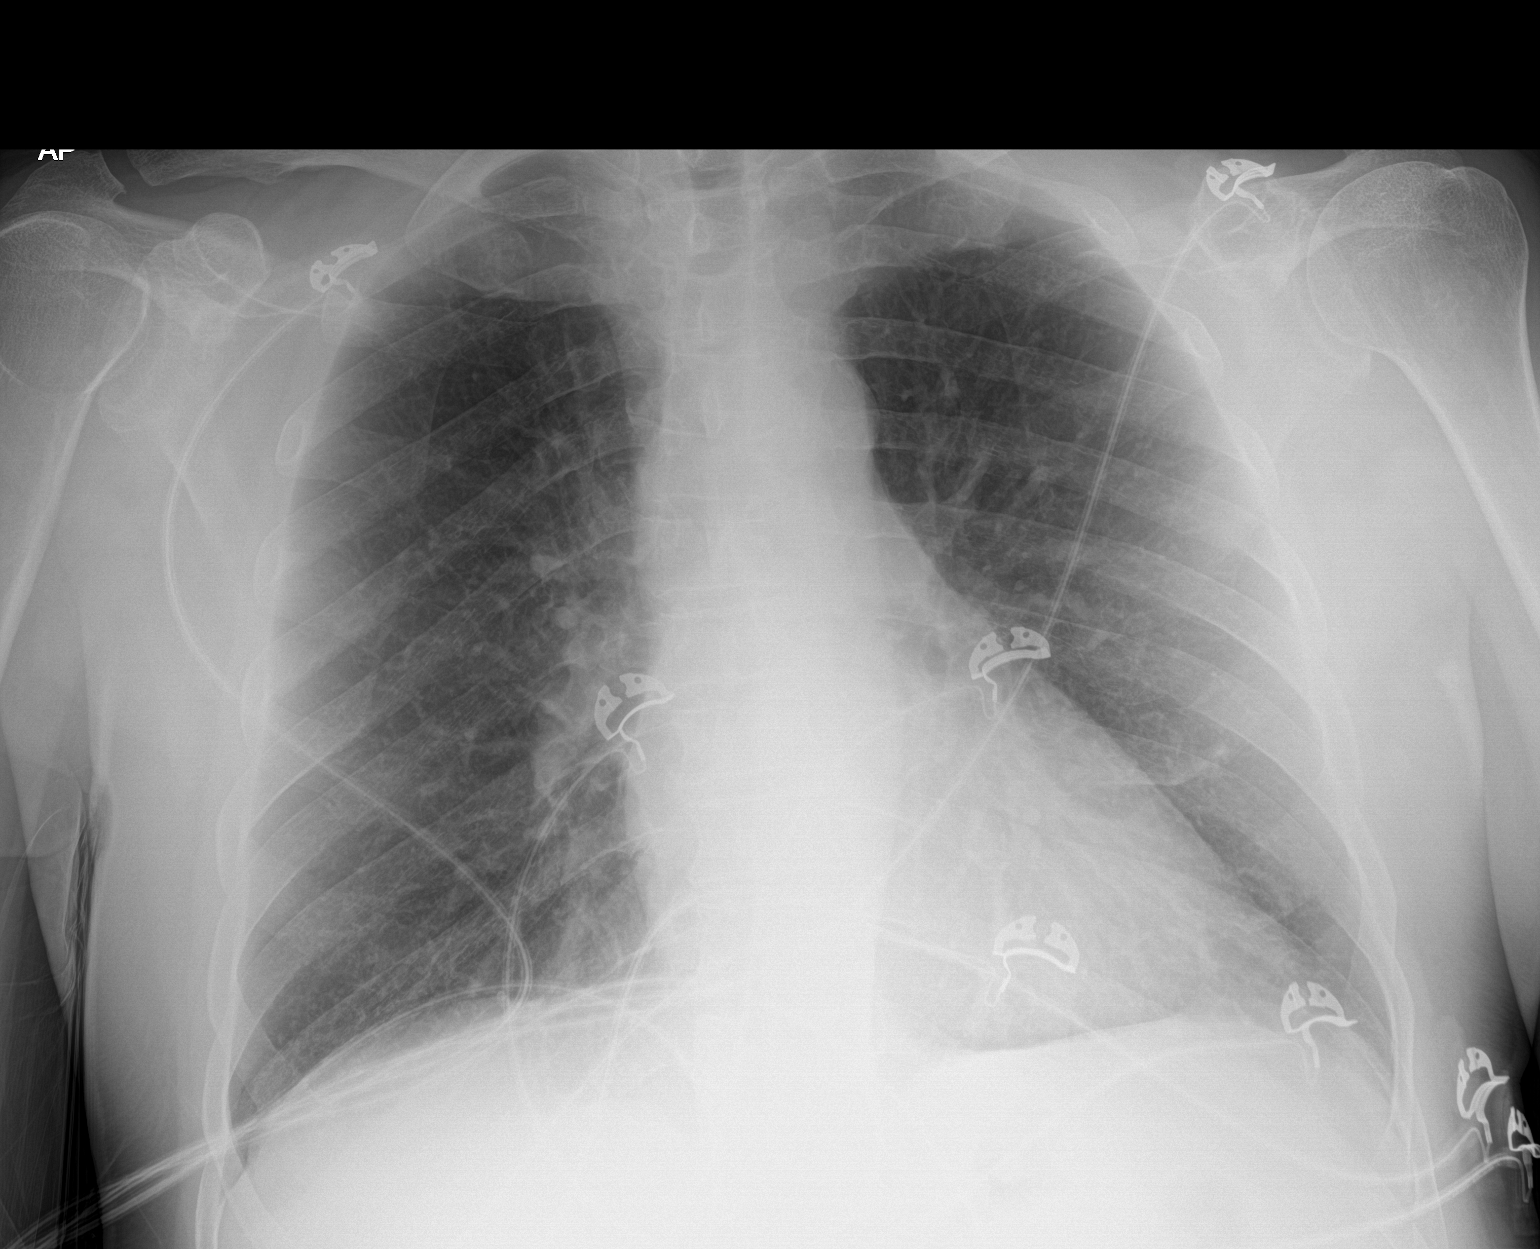

[1 of 1 positions shown; findings below may reference images not displayed]

FINDINGS: The heart size and mediastinal contours are within normal limits.
Mild diffuse interstitial prominence. No focal airspace
consolidation, pleural effusion, or pneumothorax. The visualized
skeletal structures are unremarkable.
IMPRESSION: Mild diffuse interstitial prominence which may reflect bronchitic
type lung changes versus edema or atypical/viral infection.

## 2021-11-15 DIAGNOSIS — I10 Essential (primary) hypertension: Secondary | ICD-10-CM | POA: Diagnosis not present

## 2021-11-22 ENCOUNTER — Other Ambulatory Visit: Payer: Self-pay | Admitting: Cardiology

## 2021-11-22 DIAGNOSIS — N401 Enlarged prostate with lower urinary tract symptoms: Secondary | ICD-10-CM

## 2021-11-23 ENCOUNTER — Other Ambulatory Visit: Payer: Self-pay | Admitting: Cardiology

## 2021-11-23 DIAGNOSIS — E785 Hyperlipidemia, unspecified: Secondary | ICD-10-CM

## 2021-11-26 ENCOUNTER — Other Ambulatory Visit: Payer: Self-pay | Admitting: Cardiology

## 2021-11-26 DIAGNOSIS — I10 Essential (primary) hypertension: Secondary | ICD-10-CM

## 2021-12-13 ENCOUNTER — Other Ambulatory Visit: Payer: Self-pay | Admitting: Cardiology

## 2021-12-13 DIAGNOSIS — I10 Essential (primary) hypertension: Secondary | ICD-10-CM

## 2021-12-15 DIAGNOSIS — I10 Essential (primary) hypertension: Secondary | ICD-10-CM | POA: Diagnosis not present

## 2022-01-07 DIAGNOSIS — R5381 Other malaise: Secondary | ICD-10-CM | POA: Diagnosis not present

## 2022-01-07 DIAGNOSIS — W57XXXA Bitten or stung by nonvenomous insect and other nonvenomous arthropods, initial encounter: Secondary | ICD-10-CM | POA: Diagnosis not present

## 2022-01-07 DIAGNOSIS — S40861A Insect bite (nonvenomous) of right upper arm, initial encounter: Secondary | ICD-10-CM | POA: Diagnosis not present

## 2022-01-07 DIAGNOSIS — F5101 Primary insomnia: Secondary | ICD-10-CM | POA: Diagnosis not present

## 2022-01-12 DIAGNOSIS — I10 Essential (primary) hypertension: Secondary | ICD-10-CM | POA: Diagnosis not present

## 2022-01-12 DIAGNOSIS — H2513 Age-related nuclear cataract, bilateral: Secondary | ICD-10-CM | POA: Diagnosis not present

## 2022-01-12 DIAGNOSIS — H52223 Regular astigmatism, bilateral: Secondary | ICD-10-CM | POA: Diagnosis not present

## 2022-01-12 DIAGNOSIS — H5203 Hypermetropia, bilateral: Secondary | ICD-10-CM | POA: Diagnosis not present

## 2022-01-12 DIAGNOSIS — H35033 Hypertensive retinopathy, bilateral: Secondary | ICD-10-CM | POA: Diagnosis not present

## 2022-01-12 DIAGNOSIS — H524 Presbyopia: Secondary | ICD-10-CM | POA: Diagnosis not present

## 2022-01-15 DIAGNOSIS — I10 Essential (primary) hypertension: Secondary | ICD-10-CM | POA: Diagnosis not present

## 2022-02-09 ENCOUNTER — Other Ambulatory Visit: Payer: Self-pay | Admitting: Pharmacist

## 2022-02-09 DIAGNOSIS — I10 Essential (primary) hypertension: Secondary | ICD-10-CM

## 2022-02-09 MED ORDER — NIFEDIPINE ER 30 MG PO TB24: 30.0000 mg | ORAL_TABLET | Freq: Every day | ORAL | 3 refills | Status: DC

## 2022-02-14 DIAGNOSIS — I10 Essential (primary) hypertension: Secondary | ICD-10-CM | POA: Diagnosis not present

## 2022-02-22 ENCOUNTER — Encounter (HOSPITAL_BASED_OUTPATIENT_CLINIC_OR_DEPARTMENT_OTHER): Payer: Self-pay | Admitting: Radiology

## 2022-02-22 ENCOUNTER — Other Ambulatory Visit: Payer: Self-pay

## 2022-02-22 ENCOUNTER — Telehealth: Payer: Self-pay | Admitting: Cardiology

## 2022-02-22 ENCOUNTER — Emergency Department (HOSPITAL_BASED_OUTPATIENT_CLINIC_OR_DEPARTMENT_OTHER): Payer: Medicare Other

## 2022-02-22 ENCOUNTER — Emergency Department (HOSPITAL_BASED_OUTPATIENT_CLINIC_OR_DEPARTMENT_OTHER)
Admission: EM | Admit: 2022-02-22 | Discharge: 2022-02-22 | Disposition: A | Discharge status: Home / Self Care | Payer: Medicare Other | Attending: Emergency Medicine | Admitting: Emergency Medicine

## 2022-02-22 DIAGNOSIS — R1084 Generalized abdominal pain: Secondary | ICD-10-CM | POA: Diagnosis not present

## 2022-02-22 DIAGNOSIS — R748 Abnormal levels of other serum enzymes: Secondary | ICD-10-CM | POA: Diagnosis not present

## 2022-02-22 DIAGNOSIS — Z79899 Other long term (current) drug therapy: Secondary | ICD-10-CM | POA: Insufficient documentation

## 2022-02-22 DIAGNOSIS — M79652 Pain in left thigh: Secondary | ICD-10-CM | POA: Diagnosis not present

## 2022-02-22 DIAGNOSIS — K5730 Diverticulosis of large intestine without perforation or abscess without bleeding: Secondary | ICD-10-CM | POA: Diagnosis not present

## 2022-02-22 DIAGNOSIS — I10 Essential (primary) hypertension: Secondary | ICD-10-CM | POA: Insufficient documentation

## 2022-02-22 DIAGNOSIS — K7689 Other specified diseases of liver: Secondary | ICD-10-CM | POA: Diagnosis not present

## 2022-02-22 DIAGNOSIS — M79605 Pain in left leg: Secondary | ICD-10-CM | POA: Diagnosis not present

## 2022-02-22 DIAGNOSIS — R1032 Left lower quadrant pain: Secondary | ICD-10-CM | POA: Diagnosis not present

## 2022-02-22 DIAGNOSIS — R1030 Lower abdominal pain, unspecified: Secondary | ICD-10-CM | POA: Diagnosis present

## 2022-02-22 DIAGNOSIS — N400 Enlarged prostate without lower urinary tract symptoms: Secondary | ICD-10-CM | POA: Diagnosis not present

## 2022-02-22 LAB — URINALYSIS, ROUTINE W REFLEX MICROSCOPIC
Bilirubin Urine: NEGATIVE
Glucose, UA: NEGATIVE mg/dL
Hgb urine dipstick: NEGATIVE
Ketones, ur: NEGATIVE mg/dL
Leukocytes,Ua: NEGATIVE
Nitrite: NEGATIVE
Protein, ur: NEGATIVE mg/dL
Specific Gravity, Urine: 1.01 (ref 1.005–1.030)
pH: 6.5 (ref 5.0–8.0)

## 2022-02-22 LAB — CBC
HCT: 39.5 % (ref 39.0–52.0)
Hemoglobin: 13 g/dL (ref 13.0–17.0)
MCH: 30.2 pg (ref 26.0–34.0)
MCHC: 32.9 g/dL (ref 30.0–36.0)
MCV: 91.9 fL (ref 80.0–100.0)
Platelets: 248 10*3/uL (ref 150–400)
RBC: 4.3 MIL/uL (ref 4.22–5.81)
RDW: 13.2 % (ref 11.5–15.5)
WBC: 6.3 10*3/uL (ref 4.0–10.5)
nRBC: 0 % (ref 0.0–0.2)

## 2022-02-22 LAB — COMPREHENSIVE METABOLIC PANEL
ALT: 16 U/L (ref 0–44)
AST: 20 U/L (ref 15–41)
Albumin: 4.4 g/dL (ref 3.5–5.0)
Alkaline Phosphatase: 76 U/L (ref 38–126)
Anion gap: 8 (ref 5–15)
BUN: 21 mg/dL (ref 8–23)
CO2: 24 mmol/L (ref 22–32)
Calcium: 9.2 mg/dL (ref 8.9–10.3)
Chloride: 107 mmol/L (ref 98–111)
Creatinine, Ser: 1.27 mg/dL — ABNORMAL HIGH (ref 0.61–1.24)
GFR, Estimated: >60 mL/min (ref >60)
Glucose, Bld: 97 mg/dL (ref 70–99)
Potassium: 4.2 mmol/L (ref 3.5–5.1)
Sodium: 139 mmol/L (ref 135–145)
Total Bilirubin: 0.9 mg/dL (ref 0.3–1.2)
Total Protein: 7.2 g/dL (ref 6.5–8.1)

## 2022-02-22 LAB — LIPASE, BLOOD: Lipase: 120 U/L — ABNORMAL HIGH (ref 11–51)

## 2022-02-22 MED ORDER — IOHEXOL 300 MG/ML  SOLN
100.0000 mL | Freq: Once | INTRAMUSCULAR | Status: AC | PRN
  Administered 2022-02-22: 85 mL via INTRAVENOUS

## 2022-02-22 NOTE — ED Triage Notes (Addendum)
Patient reports to the ER for evaluation of a tick bite that happened x2 months ago. Patient reports he wants to make sure the spot on his right bicep is healing well, his PCP checked him for [facility] Spotted Fever and Lyme. Patient reports he was on antibiotics for 10 days.  ?HTN: Patient reports he has been taking Nifedipine and was having frequent urination so he quit taking it x1 month ago. Reports only taking Hydralazine. Patient reports he was taking Valsartan and was taken off it due to increased kidney levels.  ?Abdominal pain: Patient reports pain in the lower parts of his abdomen. Denies N/V/D. Patient reports 7-8 years ago he had a hernia repair and he had straining with no BM and had to manually dis-impact and caused a fissure that he states has never healed. Patient also concerned about potential mesh in his abdomen having complications.   ?Leg pain: Patient also endorses left leg pain that feels like spasms.  ? ?

## 2022-02-22 NOTE — ED Provider Notes (Signed)
?[facility]PT ?Provider Note ? ? ?CSN: [id number] ?Arrival date & time: 02/22/22  1128 ? ?  ? ?History ?Chief Complaint  ?Patient presents with  ? Abdominal Pain  ? Insect Bite  ? ? ?John Bird is a 71 y.o. male with history of hemorrhoids, hiatal hernia, IBS who presents to the emergency department with a 2-week history of intermittent abdominal pain that only occurs around 3 AM.  Patient states this abdominal pain is diffuse although mainly across the lower abdomen.  Goes away spontaneously.  This wakes him up from sleep.  Patient also complaining of left upper thigh pain and is concerned for a blood clot.  This is also been going on for 2 weeks.  No trauma or injury. ? ?Of note, patient has not been taking his nifedipine or atenolol.  He is only been taking his hydralazine.  He states that the nifedipine is giving him urinary frequency. ? ? ?Abdominal Pain ? ?  ? ?Home Medications ?Prior to Admission medications   ?Medication Sig Start Date End Date Taking? Authorizing Provider  ?hydrALAZINE (APRESOLINE) 50 MG tablet TAKE 1 TABLET BY MOUTH THREE TIMES A DAY 10/19/21   [place], Celeste C, PA-C  ?NIFEdipine (ADALAT CC) 30 MG 24 hr tablet Take 1 tablet (30 mg total) by mouth daily. 02/09/22   Adrian Prows, MD  ?omeprazole (PRILOSEC) 20 MG capsule Take 20 mg by mouth. OTC Mon, Wed, Frid    [provider]  ?rosuvastatin (CRESTOR) 10 MG tablet TAKE 1 TABLET BY MOUTH EVERY DAY 11/23/21   Adrian Prows, MD  ?terazosin (HYTRIN) 5 MG capsule TAKE 1 CAPSULE BY MOUTH AT BEDTIME. 11/23/21   Adrian Prows, MD  ?valsartan-hydrochlorothiazide (DIOVAN-HCT) 320-12.5 MG tablet Take 0.5 tablets by mouth daily.    [provider]  ?   ? ?Allergies    ?Patient has no known allergies.   ? ?Review of Systems   ?Review of Systems  ?Gastrointestinal:  Positive for abdominal pain.  ?All other systems reviewed and are negative. ? ?Physical Exam ?Updated Vital Signs ?BP (!) 198/82   Pulse (!) 57   Temp  97.9 ?F (36.6 ?C)   Resp 16   Ht '5\' 7"'$  (1.702 m)   Wt 86.2 kg   SpO2 99%   BMI 29.76 kg/m?  ?Physical Exam ?Vitals and nursing note reviewed.  ?Constitutional:   ?   General: He is not in acute distress. ?   Appearance: Normal appearance.  ?HENT:  ?   Head: Normocephalic and atraumatic.  ?Eyes:  ?   General:     ?   Right eye: No discharge.     ?   Left eye: No discharge.  ?Cardiovascular:  ?   Comments: Regular rate and rhythm.  S1/S2 are distinct without any evidence of murmur, rubs, or gallops.  Radial pulses are 2+ bilaterally.  Dorsalis pedis pulses are 2+ bilaterally.  No evidence of pedal edema. ?Pulmonary:  ?   Comments: Clear to auscultation bilaterally.  Normal effort.  No respiratory distress.  No evidence of wheezes, rales, or rhonchi heard throughout. ?Abdominal:  ?   General: Abdomen is flat. Bowel sounds are normal. There is no distension.  ?   Tenderness: There is abdominal tenderness in the suprapubic area. There is no guarding or rebound.  ?Musculoskeletal:     ?   General: Normal range of motion.  ?   Cervical back: Neck supple.  ?   Comments: Left lower extremity is nontender  or swollen.  No evidence of cellulitis or purulence.  Strong pulses distally.  ?Skin: ?   General: Skin is warm and dry.  ?   Findings: No rash.  ?Neurological:  ?   General: No focal deficit present.  ?   Mental Status: He is alert.  ?Psychiatric:     ?   Mood and Affect: Mood normal.     ?   Behavior: Behavior normal.  ? ? ?ED Results / Procedures / Treatments   ?Labs ?(all labs ordered are listed, but only abnormal results are displayed) ?Labs Reviewed  ?LIPASE, BLOOD - Abnormal; Notable for the following components:  ?    Result Value  ? Lipase 120 (*)   ? All other components within normal limits  ?COMPREHENSIVE METABOLIC PANEL - Abnormal; Notable for the following components:  ? Creatinine, Ser 1.27 (*)   ? All other components within normal limits  ?URINALYSIS, ROUTINE W REFLEX MICROSCOPIC - Abnormal; Notable for  the following components:  ? Color, Urine COLORLESS (*)   ? All other components within normal limits  ?URINE CULTURE  ?CBC  ? ? ?EKG ?None ? ?Radiology ?CT ABDOMEN PELVIS W CONTRAST ? ?Result Date: 02/22/2022 ?CLINICAL DATA:  Abdominal pain EXAM: CT ABDOMEN AND PELVIS WITH CONTRAST TECHNIQUE: Multidetector CT imaging of the abdomen and pelvis was performed using the standard protocol following bolus administration of intravenous contrast. RADIATION DOSE REDUCTION: This exam was performed according to the departmental dose-optimization program which includes automated exposure control, adjustment of the mA and/or kV according to patient size and/or use of iterative reconstruction technique. CONTRAST:  61m OMNIPAQUE IOHEXOL 300 MG/ML  SOLN COMPARISON:  CT abdomen and pelvis 04/05/2016 FINDINGS: Lower chest: No acute abnormality. Hepatobiliary: Liver is normal in size and contour. Bilobed cyst in the right hepatic lobe measuring up to 2.5 cm. Gallbladder appears normal. No biliary ductal dilatation. Pancreas: Unremarkable. No pancreatic ductal dilatation or surrounding inflammatory changes. Spleen: Normal in size without focal abnormality. Adrenals/Urinary Tract: Adrenal glands are unremarkable. Kidneys are normal, without renal calculi, focal lesion, or hydronephrosis. Bladder is unremarkable. Stomach/Bowel: Small hiatal hernia. No bowel obstruction, free air or pneumatosis. Colonic diverticulosis. No bowel wall edema. Appendix is normal. Vascular/Lymphatic: Aortic atherosclerosis. No enlarged abdominal or pelvic lymph nodes. Reproductive: Prostate gland is enlarged. Other: No ascites. Musculoskeletal: No acute or significant osseous findings. IMPRESSION: 1. No acute process identified. 2. Small hiatal hernia. 3. Colonic diverticulosis. 4. Prostatomegaly. Electronically Signed   By: DOfilia NeasM.D.   On: 02/22/2022 13:27  ? ?[place] Img Lower Unilateral Left ? ?Result Date: 02/22/2022 ?CLINICAL DATA:  Left  lower extremity pain. Evaluate for DVT. Family history of DVT. EXAM: LEFT LOWER EXTREMITY VENOUS DOPPLER ULTRASOUND TECHNIQUE: Gray-scale sonography with graded compression, as well as color Doppler and duplex ultrasound were performed to evaluate the lower extremity deep venous systems from the level of the common femoral vein and including the common femoral, femoral, profunda femoral, popliteal and calf veins including the posterior tibial, peroneal and gastrocnemius veins when visible. The superficial great saphenous vein was also interrogated. Spectral Doppler was utilized to evaluate flow at rest and with distal augmentation maneuvers in the common femoral, femoral and popliteal veins. COMPARISON:  None Available. FINDINGS: Contralateral Common Femoral Vein: Respiratory phasicity is normal and symmetric with the symptomatic side. No evidence of thrombus. Normal compressibility. Common Femoral Vein: No evidence of thrombus. Normal compressibility, respiratory phasicity and response to augmentation. Saphenofemoral Junction: No evidence of thrombus. Normal compressibility and  flow on color Doppler imaging. Profunda Femoral Vein: No evidence of thrombus. Normal compressibility and flow on color Doppler imaging. Femoral Vein: No evidence of thrombus. Normal compressibility, respiratory phasicity and response to augmentation. Popliteal Vein: No evidence of thrombus. Normal compressibility, respiratory phasicity and response to augmentation. Calf Veins: No evidence of thrombus. Normal compressibility and flow on color Doppler imaging. Superficial Great Saphenous Vein: No evidence of thrombus. Normal compressibility. Other Findings:  None. IMPRESSION: No evidence of DVT within the left lower extremity. Electronically Signed   By: Sandi Mariscal M.D.   On: 02/22/2022 13:10   ? ?Procedures ?Procedures  ? ? ?Medications Ordered in ED ?Medications  ?iohexol (OMNIPAQUE) 300 MG/ML solution 100 mL (85 mLs Intravenous Contrast  Given 02/22/22 1300)  ? ? ?ED Course/ Medical Decision Making/ A&P ?Clinical Course as of 02/22/22 1339  ?Mon Feb 22, 2022  ?1206 Patient's blood pressure noted to be elevated today.  This is likely due to the p

## 2022-02-22 NOTE — ED Notes (Signed)
Patient transported to Ultrasound 

## 2022-02-22 NOTE — Discharge Instructions (Signed)
Your labs and imaging did not reveal any significant issues today.  I would like for you to follow-up with your primary care provider for further evaluation.  Please avoid eating meals late in the evening.  Please return to the emergency department for any worsening symptoms you might have. ?

## 2022-02-23 ENCOUNTER — Encounter: Payer: Self-pay | Admitting: Cardiology

## 2022-02-23 LAB — URINE CULTURE: Culture: NO GROWTH

## 2022-03-05 NOTE — Telephone Encounter (Signed)
done

## 2022-03-16 DIAGNOSIS — R1013 Epigastric pain: Secondary | ICD-10-CM | POA: Diagnosis not present

## 2022-03-16 DIAGNOSIS — R748 Abnormal levels of other serum enzymes: Secondary | ICD-10-CM | POA: Diagnosis not present

## 2022-03-16 DIAGNOSIS — K449 Diaphragmatic hernia without obstruction or gangrene: Secondary | ICD-10-CM | POA: Diagnosis not present

## 2022-03-16 DIAGNOSIS — N400 Enlarged prostate without lower urinary tract symptoms: Secondary | ICD-10-CM | POA: Diagnosis not present

## 2022-03-17 DIAGNOSIS — I10 Essential (primary) hypertension: Secondary | ICD-10-CM | POA: Diagnosis not present

## 2022-03-22 DIAGNOSIS — R1013 Epigastric pain: Secondary | ICD-10-CM | POA: Diagnosis not present

## 2022-03-22 DIAGNOSIS — R748 Abnormal levels of other serum enzymes: Secondary | ICD-10-CM | POA: Diagnosis not present

## 2022-03-22 DIAGNOSIS — N400 Enlarged prostate without lower urinary tract symptoms: Secondary | ICD-10-CM | POA: Diagnosis not present

## 2022-03-30 DIAGNOSIS — R1013 Epigastric pain: Secondary | ICD-10-CM | POA: Diagnosis not present

## 2022-04-16 DIAGNOSIS — I10 Essential (primary) hypertension: Secondary | ICD-10-CM | POA: Diagnosis not present

## 2022-05-02 ENCOUNTER — Other Ambulatory Visit: Payer: Self-pay | Admitting: Cardiology

## 2022-05-02 DIAGNOSIS — I10 Essential (primary) hypertension: Secondary | ICD-10-CM

## 2022-05-10 ENCOUNTER — Telehealth: Payer: Self-pay

## 2022-05-10 ENCOUNTER — Ambulatory Visit: Payer: Medicare Other | Admitting: Cardiology

## 2022-05-10 ENCOUNTER — Encounter: Payer: Self-pay | Admitting: Cardiology

## 2022-05-10 VITALS — BP 147/59 | HR 68 | Temp 98.1°F | Resp 16 | Ht 67.0 in | Wt 189.6 lb

## 2022-05-10 DIAGNOSIS — I10 Essential (primary) hypertension: Secondary | ICD-10-CM

## 2022-05-10 DIAGNOSIS — R1013 Epigastric pain: Secondary | ICD-10-CM | POA: Diagnosis not present

## 2022-05-10 DIAGNOSIS — R079 Chest pain, unspecified: Secondary | ICD-10-CM

## 2022-05-10 DIAGNOSIS — K219 Gastro-esophageal reflux disease without esophagitis: Secondary | ICD-10-CM

## 2022-05-10 MED ORDER — VALSARTAN-HYDROCHLOROTHIAZIDE 320-12.5 MG PO TABS: 1.0000 | ORAL_TABLET | ORAL | 1 refills | Status: DC

## 2022-05-10 MED ORDER — ESOMEPRAZOLE MAGNESIUM 40 MG PO CPDR: 40.0000 mg | DELAYED_RELEASE_CAPSULE | Freq: Every day | ORAL | 2 refills | Status: DC

## 2022-05-10 NOTE — Progress Notes (Unsigned)
Primary Physician/Referring:  Holland Commons, FNP  Patient ID: John Bird, male    DOB: 1951/08/25, 71 y.o.   MRN: [id number]  Chief Complaint  Patient presents with   Chest Pain    HPI: John Bird  is a 71 y.o. male Caucasian  male  with chronic asymptomatic sinus bradycardia, hyperlipidemia,  prior 10-15-pack-year  cigarette use, quit remotely, mild aortic ectasia, family history of premature coronary artery disease in his brother having MI at age 37Y, chronic stable angina and DOE with nuclear stress test on 09/17/2018 revealing mild inferior ischemia.  Patient called our office stating that he has been having chest pain and difficulty sleeping and wanted to be seen urgently.  He was seen in the emergency room on 02/22/2022 for abdominal discomfort noticed epigastric pain present only during the evening when he lays down.  Otherwise feels well and has continued his physical activity as usual.  He is presently on RPM and CPM and average blood pressure has been systolic <428 mmHg.  No PND or orthopnea, no TIA.   Past Medical History:  Diagnosis Date   Allergy    Anal fissure    Cancer ([place])    basal cell CA- forehead   DDD (degenerative disc disease), cervical    Hemorrhoids    Hernia, inguinal    Hiatal hernia    IBS (irritable bowel syndrome)    Ulcer    Past Surgical History:  Procedure Laterality Date   FINGER SURGERY     Left Pinky finger   INGUINAL HERNIA REPAIR     bilateral   MOHS SURGERY     top of his head   NASAL SEPTUM SURGERY     right knee surgery     TONSILLECTOMY AND ADENOIDECTOMY     TONSILLECTOMY AND ADENOIDECTOMY     Family History  Problem Relation Age of Onset   Hypertension Mother    Heart failure Mother 97       Passed at 3   Heart disease Father    Alzheimer's disease Father 82   Heart disease Brother    Colon cancer Neg Hx    Esophageal cancer Neg Hx    Rectal cancer Neg Hx    Stomach cancer Neg Hx    Social History    Tobacco Use   Smoking status: Former    Packs/day: 1.50    Years: 15.00    Total pack years: 22.50    Types: Cigarettes    Quit date: 10/11/1980    Years since quitting: 41.6   Smokeless tobacco: Never  Substance Use Topics   Alcohol use: Yes    Comment: 2-3 drinks per MONTH   Marital Status: Married   Current Outpatient Medications on File Prior to Visit  Medication Sig Dispense Refill   hydrALAZINE (APRESOLINE) 50 MG tablet TAKE 1 TABLET BY MOUTH THREE TIMES A DAY 270 tablet 1   No current facility-administered medications on file prior to visit.   Review of Systems  Cardiovascular:  Positive for dyspnea on exertion (stable). Negative for chest pain and leg swelling.  Gastrointestinal:  Negative for melena.        05/10/2022    2:34 PM 02/22/2022    1:30 PM 02/22/2022    1:15 PM  Vitals with BMI  Height 5' 7"     Weight 189 lbs 10 oz    BMI 76.81    Systolic 157 262 035  Diastolic 59 85 82  Pulse  68 48 57    Physical Exam Vitals reviewed.  Constitutional:      Appearance: He is well-developed. He is obese.     Comments: Mildly obese  Neck:     Thyroid: No thyromegaly.     Vascular: No JVD.  Cardiovascular:     Rate and Rhythm: Regular rhythm. Bradycardia present.     Pulses: Normal pulses and intact distal pulses.     Heart sounds: Normal heart sounds. No murmur heard.    No gallop.  Pulmonary:     Effort: Pulmonary effort is normal.     Breath sounds: Normal breath sounds.  Abdominal:     Palpations: Abdomen is soft.     Tenderness: There is abdominal tenderness in the epigastric area.  Musculoskeletal:     Right lower leg: No edema.     Left lower leg: No edema.    Laboratory examination:      Latest Ref Rng & Units 02/22/2022   11:37 AM 07/21/2021   11:39 AM 01/01/2021   10:34 AM  CMP  Glucose 70 - 99 mg/dL 97  89  97   BUN 8 - 23 mg/dL 21  20  23    Creatinine 0.61 - 1.24 mg/dL 1.27  1.30  1.32   Sodium 135 - 145 mmol/L 139  140  141    Potassium 3.5 - 5.1 mmol/L 4.2  4.7  4.5   Chloride 98 - 111 mmol/L 107  104  107   CO2 22 - 32 mmol/L 24  25  21    Calcium 8.9 - 10.3 mg/dL 9.2  8.8  8.6   Total Protein 6.5 - 8.1 g/dL 7.2     Total Bilirubin 0.3 - 1.2 mg/dL 0.9     Alkaline Phos 38 - 126 U/L 76     AST 15 - 41 U/L 20     ALT 0 - 44 U/L 16         Latest Ref Rng & Units 02/22/2022   11:37 AM 09/30/2020   12:50 PM 08/07/2019   11:56 AM  CBC  WBC 4.0 - 10.5 K/uL 6.3  4.1  7.9   Hemoglobin 13.0 - 17.0 g/dL 13.0  13.5  14.0   Hematocrit 39.0 - 52.0 % 39.5  40.4  41.8   Platelets 150 - 400 K/uL 248  197  239.0     External labs:   12/06/2020:  Hb 11.3/HCT 40.4, platelets 201, normal indicis CMP otherwise normal.  Sodium 127, potassium 4.0, serum glucose 149 mg.  121, creatinine 1.6. Total cholesterol 98, triglycerides 211, HDL 38, LDL 70.  09/26/2018:  Cholesterol 156, triglycerides 67, HDL 44, LDL 99.  Creatinine 1.2, EGFR 62/72, potassium 4.4, CMP normal.  07/24/2018:  Potassium 4.3, BUN 17, creatinine 1.2.  EGFR 60/69 mL, CMP otherwise normal.  HB 13.6/HCT 40.2, platelets 227.  Total cholesterol 181, triglycerides 81, HDL 44, LDL 120.  Radiology   No results found.  Cardiac Studies:   Lexiscan Sestamibi stress test 09/15/2018: 1. Lexiscan stress test was performed. Exercise capacity was not assessed. Stress symptoms included chest pressure, dyspnea, syncope. Resting blood pressure 138/82 mmHg, dropped to peak effect blood pressure of 82/40 mmHg. The resting and stress electrocardiogram demonstrated normal sinus rhythm, normal resting conduction, no resting arrhythmias and normal rest repolarization. Stress EKG is non diagnostic for ischemia as it is a pharmacologic stress. 2. The overall quality of the study is good. Left ventricular cavity is noted to be normal on  the rest and stress studies. Gated SPECT images reveal normal myocardial thickening and wall motion. The left ventricular ejection fraction was  calculated or visually estimated to be 68%. SPECT images reveal small size, mild intensity, reversible perfusion defect in basal inferior myocardium suggestive of ischemia. 3. Low risk study.  Abdominal Aortic Duplex 09/12/2020: The maximum aorta (sac) diameter is 2.33 cm (dist) with minimal ectasia in the distal aorta. Moderate heterogeneous plaque observed in the proximal, mid and distal aorta.  Normal flow velocities noted in the iliac vessels proximally.  No AAA noted. Compared to 09/19/2018, 3 cm AAA not present and probably was a measurement error previously.  PCV ECHOCARDIOGRAM COMPLETE 08/03/2021  Narrative Echocardiogram 08/03/2021: Left ventricle cavity is normal in size and wall thickness. Normal global wall motion. Normal LV systolic function with EF 69%. Doppler evidence of grade I (impaired) diastolic dysfunction, normal LAP. Left atrial cavity is mildly dilated. Aneurysmal interatrial septum without 2D or color Doppler evidence of shunting. Structurally normal trileaflet aortic valve. No evidence of aortic stenosis. Mild (Grade I) aortic regurgitation. Estimated right atrial pressure 8 mmHg. Previous study in 2019 reported grade 2 DD, mild MR-not appreciated on this study.    EKG   EKG 05/10/2022: Normal sinus rhythm at rate of 63 bpm.  Normal EKG. Compared to 07/20/2021, sinus bradycardia at rate of 45 bpm.  Assessment     ICD-10-CM   1. Essential (primary) hypertension  I10 valsartan-hydrochlorothiazide (DIOVAN-HCT) 320-12.5 MG tablet    2. Chest pain of uncertain etiology  Y65.0     3. Epigastric pain  R10.13 EKG 12-Lead    esomeprazole (NEXIUM) 40 MG capsule    4. Gastroesophageal reflux disease without esophagitis  K21.9 esomeprazole (NEXIUM) 40 MG capsule      Medications Discontinued During This Encounter  Medication Reason   NIFEdipine (ADALAT CC) 30 MG 24 hr tablet    rosuvastatin (CRESTOR) 10 MG tablet    terazosin (HYTRIN) 5 MG capsule    omeprazole  (PRILOSEC) 20 MG capsule Change in therapy   valsartan-hydrochlorothiazide (DIOVAN-HCT) 320-12.5 MG tablet Reorder    Meds ordered this encounter  Medications   esomeprazole (NEXIUM) 40 MG capsule    Sig: Take 1 capsule (40 mg total) by mouth daily before lunch.    Dispense:  30 capsule    Refill:  2   valsartan-hydrochlorothiazide (DIOVAN-HCT) 320-12.5 MG tablet    Sig: Take 1 tablet by mouth every morning.    Dispense:  90 tablet    Refill:  1    DX Code Needed  .   Recommendations:   AJ CRUNKLETON  is a 71 y.o. Caucasian  male  with chronic asymptomatic sinus bradycardia, hyperlipidemia,  prior 10-15-pack-year  cigarette use, quit remotely, and aortic ectasia,, family history of premature coronary artery disease in his brother having MI at age 28Y, mild chronic dyspnea. Patient called our office stating that he has been having chest pain and difficulty sleeping and wanted to be seen urgently.  He was seen in the emergency room on 02/22/2022 for abdominal discomfort noticed epigastric pain present only during the evening when he lays down.  Otherwise feels well and has continued his physical activity as usual.  I reviewed the records from the emergency room, lipase was mildly elevated however labs at the PCP did not reveal any abnormality.  Do not suspect acute pancreatitis  His chest pain does not suggest angina pectoris as well.  Probably has GERD, he has been taking omeprazole, we  will switch to Nexium.  His EKG is normal.  Blood pressure is well controlled.  An appointment to see me in January 2024, he will keep that.   Adrian Prows, PA-C 05/13/2022, 10:27 PM Office: [phone]

## 2022-05-10 NOTE — Telephone Encounter (Signed)
Patient called and stated that every night he is woken up from his sleep caused by chest pain. He denies any SOB or dizziness.Patient is on the schedule for 2:30.

## 2022-05-17 DIAGNOSIS — I10 Essential (primary) hypertension: Secondary | ICD-10-CM | POA: Diagnosis not present

## 2022-06-01 ENCOUNTER — Other Ambulatory Visit: Payer: Self-pay

## 2022-06-01 DIAGNOSIS — I10 Essential (primary) hypertension: Secondary | ICD-10-CM

## 2022-06-01 MED ORDER — HYDRALAZINE HCL 50 MG PO TABS: 50.0000 mg | ORAL_TABLET | Freq: Three times a day (TID) | ORAL | 1 refills | Status: DC

## 2022-06-16 DIAGNOSIS — I10 Essential (primary) hypertension: Secondary | ICD-10-CM | POA: Diagnosis not present

## 2022-08-16 DIAGNOSIS — I10 Essential (primary) hypertension: Secondary | ICD-10-CM | POA: Diagnosis not present

## 2022-09-15 DIAGNOSIS — I10 Essential (primary) hypertension: Secondary | ICD-10-CM | POA: Diagnosis not present

## 2022-10-13 ENCOUNTER — Telehealth: Payer: Self-pay

## 2022-10-13 ENCOUNTER — Encounter: Payer: Self-pay | Admitting: Cardiology

## 2022-10-13 ENCOUNTER — Ambulatory Visit: Payer: Medicare Other | Admitting: Cardiology

## 2022-10-13 VITALS — BP 135/68 | HR 61 | Resp 16 | Ht 67.0 in | Wt 189.8 lb

## 2022-10-13 DIAGNOSIS — I10 Essential (primary) hypertension: Secondary | ICD-10-CM

## 2022-10-13 DIAGNOSIS — N1831 Chronic kidney disease, stage 3a: Secondary | ICD-10-CM

## 2022-10-13 DIAGNOSIS — R0609 Other forms of dyspnea: Secondary | ICD-10-CM | POA: Diagnosis not present

## 2022-10-13 DIAGNOSIS — K648 Other hemorrhoids: Secondary | ICD-10-CM

## 2022-10-13 NOTE — Telephone Encounter (Signed)
Patient's home BP is moderately controlled on current antihypertensive regimen. BP could probably withstand medication up titration.   Average Systolic BP Level 353.2 mmHg Lowest Systolic BP Level 88 mmHg Highest Systolic BP Level 992 mmHg  Average Diastolic BP Level 42.68 mmHg Lowest Diastolic BP Level 55 mmHg Highest Diastolic BP Level 97 mmHg  Average Pulse Level 64.14 BPM Lowest Pulse Level 50 BPM Highest Pulse Level 103 BPM  10/13/2022 Wednesday at 07:45 AM 136 / 66      10/13/2022 Wednesday at 07:42 AM 148 / 67      10/13/2022 Wednesday at 07:41 AM 153 / 69      10/12/2022 Tuesday at 08:45 AM 137 / 66      10/12/2022 Tuesday at 08:44 AM 132 / 67      10/11/2022 Monday at 10:07 AM 149 / 68      10/11/2022 Monday at 10:06 AM 151 / 70      10/11/2022 Monday at 10:05 AM 153 / 74      10/10/2022 Sunday at 11:15 AM 141 / 64

## 2022-10-13 NOTE — Telephone Encounter (Signed)
-----   Message from Yetta Flock, MD sent at 10/13/2022 12:51 PM EST ----- John Bird can you help coordinate a routine office visit for this patient? Thanks  ----- Message ----- From: Adrian Prows, MD Sent: 10/13/2022  10:28 AM EST To: Yetta Flock, MD  Blood in the tissue after he wipes. He wanted me to inform you.

## 2022-10-13 NOTE — Progress Notes (Signed)
Primary Physician/Referring:  Holland Commons, FNP  Patient ID: John Bird, male    DOB: 1951-01-29, 72 y.o.   MRN: [id number]  No chief complaint on file.   HPI: John Bird  is a 72 y.o. male Caucasian  male  with chronic asymptomatic sinus bradycardia, hyperlipidemia,  prior 10-15-pack-year  cigarette use, quit remotely, mild aortic ectasia, family history of premature coronary artery disease in his brother having MI at age 24Y, chronic stable angina and DOE with nuclear stress test on 09/17/2018 revealing mild inferior ischemia.  Patient continues to have mild epigastric discomfort occasionally especially after eating, he has known mild hiatal hernia.  Continues to be physically active without any chest pain per se, dyspnea is remained stable.  He is presently on RPM and CPM and average blood pressure has been systolic <433 mmHg.  No PND or orthopnea, no TIA.   Past Medical History:  Diagnosis Date   Allergy    Anal fissure    Cancer ([place])    basal cell CA- forehead   DDD (degenerative disc disease), cervical    Hemorrhoids    Hernia, inguinal    Hiatal hernia    IBS (irritable bowel syndrome)    Ulcer    Past Surgical History:  Procedure Laterality Date   FINGER SURGERY     Left Pinky finger   INGUINAL HERNIA REPAIR     bilateral   MOHS SURGERY     top of his head   NASAL SEPTUM SURGERY     right knee surgery     TONSILLECTOMY AND ADENOIDECTOMY     TONSILLECTOMY AND ADENOIDECTOMY     Family History  Problem Relation Age of Onset   Hypertension Mother    Heart failure Mother 48       Passed at 21   Heart disease Father    Alzheimer's disease Father 68   Heart disease Brother    Colon cancer Neg Hx    Esophageal cancer Neg Hx    Rectal cancer Neg Hx    Stomach cancer Neg Hx    Social History   Tobacco Use   Smoking status: Former    Packs/day: 1.50    Years: 15.00    Total pack years: 22.50    Types: Cigarettes    Quit date: 10/11/1980     Years since quitting: 42.0   Smokeless tobacco: Never  Substance Use Topics   Alcohol use: Yes    Comment: 2-3 drinks per MONTH   Marital Status: Married    Current Outpatient Medications:    esomeprazole (NEXIUM) 40 MG capsule, Take 1 capsule (40 mg total) by mouth daily before lunch., Disp: 30 capsule, Rfl: 2   hydrALAZINE (APRESOLINE) 50 MG tablet, Take 1 tablet (50 mg total) by mouth 3 (three) times daily., Disp: 270 tablet, Rfl: 1   valsartan-hydrochlorothiazide (DIOVAN-HCT) 320-12.5 MG tablet, Take 1 tablet by mouth every morning. (Patient taking differently: Take 0.5 tablets by mouth every morning.), Disp: 90 tablet, Rfl: 1   Review of Systems  Cardiovascular:  Positive for dyspnea on exertion (stable). Negative for chest pain and leg swelling.  Gastrointestinal:  Negative for melena.        10/13/2022    9:49 AM 05/10/2022    2:34 PM 02/22/2022    1:30 PM  Vitals with BMI  Height _0  _1    Weight 189 lbs 13 oz 189 lbs 10 oz   BMI 29.72 29.69  Systolic 767 209 470  Diastolic 68 59 85  Pulse 61 68 48    Physical Exam Vitals reviewed.  Constitutional:      Appearance: He is well-developed.     Comments: Mildly obese  Neck:     Thyroid: No thyromegaly.     Vascular: No JVD.  Cardiovascular:     Rate and Rhythm: Normal rate and regular rhythm.     Pulses: Normal pulses and intact distal pulses.     Heart sounds: Normal heart sounds. No murmur heard.    No gallop.  Pulmonary:     Effort: Pulmonary effort is normal.     Breath sounds: Normal breath sounds.  Abdominal:     General: Bowel sounds are normal.     Palpations: Abdomen is soft.     Tenderness: There is no abdominal tenderness.  Musculoskeletal:     Right lower leg: No edema.     Left lower leg: No edema.    Laboratory examination:      Latest Ref Rng & Units 02/22/2022   11:37 AM 07/21/2021   11:39 AM 01/01/2021   10:34 AM  CMP  Glucose 70 - 99 mg/dL 97  89  97   BUN 8 - 23 mg/dL _0 Creatinine 0.61 - 1.24 mg/dL 1.27  1.30  1.32   Sodium 135 - 145 mmol/L 139  140  141   Potassium 3.5 - 5.1 mmol/L 4.2  4.7  4.5   Chloride 98 - 111 mmol/L 107  104  107   CO2 22 - 32 mmol/L _1 Calcium 8.9 - 10.3 mg/dL 9.2  8.8  8.6   Total Protein 6.5 - 8.1 g/dL 7.2     Total Bilirubin 0.3 - 1.2 mg/dL 0.9     Alkaline Phos 38 - 126 U/L 76     AST 15 - 41 U/L 20     ALT 0 - 44 U/L 16         Latest Ref Rng & Units 02/22/2022   11:37 AM 09/30/2020   12:50 PM 08/07/2019   11:56 AM  CBC  WBC 4.0 - 10.5 K/uL 6.3  4.1  7.9   Hemoglobin 13.0 - 17.0 g/dL 13.0  13.5  14.0   Hematocrit 39.0 - 52.0 % 39.5  40.4  41.8   Platelets 150 - 400 K/uL 248  197  239.0     External labs:   Labs 10/30/2021:  Total cholesterol 139, triglycerides 60, HDL 45, LDL 82.  TSH normal at 2.03.  Hb 13.0/HCT 39.1, platelets 248, normal indicis.  Sodium 141, potassium 4.5, BUN 20, creatinine 1.25, EGFR 58 mL.  Radiology   No results found.  Cardiac Studies:   Lexiscan Sestamibi stress test 09/15/2018: 1. Lexiscan stress test was performed. Exercise capacity was not assessed. Stress symptoms included chest pressure, dyspnea, syncope. Resting blood pressure 138/82 mmHg, dropped to peak effect blood pressure of 82/40 mmHg. The resting and stress electrocardiogram demonstrated normal sinus rhythm, normal resting conduction, no resting arrhythmias and normal rest repolarization. Stress EKG is non diagnostic for ischemia as it is a pharmacologic stress. 2. The overall quality of the study is good. Left ventricular cavity is noted to be normal on the rest and stress studies. Gated SPECT images reveal normal myocardial thickening and wall motion. The left ventricular ejection fraction was calculated or visually estimated to be 68%. SPECT images reveal small size, mild  intensity, reversible perfusion defect in basal inferior myocardium suggestive of ischemia. 3. Low risk study.  Abdominal Aortic Duplex  09/12/2020: The maximum aorta (sac) diameter is 2.33 cm (dist) with minimal ectasia in the distal aorta. Moderate heterogeneous plaque observed in the proximal, mid and distal aorta.  Normal flow velocities noted in the iliac vessels proximally.  No AAA noted. Compared to 09/19/2018, 3 cm AAA not present and probably was a measurement error previously.  PCV ECHOCARDIOGRAM COMPLETE 08/03/2021  Narrative Echocardiogram 08/03/2021: Left ventricle cavity is normal in size and wall thickness. Normal global wall motion. Normal LV systolic function with EF 69%. Doppler evidence of grade I (impaired) diastolic dysfunction, normal LAP. Left atrial cavity is mildly dilated. Aneurysmal interatrial septum without 2D or color Doppler evidence of shunting. Structurally normal trileaflet aortic valve. No evidence of aortic stenosis. Mild (Grade I) aortic regurgitation. Estimated right atrial pressure 8 mmHg. Previous study in 2019 reported grade 2 DD, mild MR-not appreciated on this study.    EKG   EKG 10/13/2022: Normal sinus rhythm with rate of 60 bpm, normal EKG. Compared to 05/10/2022, no significant change.  Assessment     ICD-10-CM   1. Essential (primary) hypertension  I10 EKG 12-Lead    2. Stage 3a chronic kidney disease (HCC)  N18.31     3. Dyspnea on exertion  R06.09     4. Internal hemorrhoids  K64.8       There are no discontinued medications.   No orders of the defined types were placed in this encounter.  Recommendations:   John Bird  is a 72 y.o. Caucasian  male  with chronic asymptomatic sinus bradycardia, hyperlipidemia,  prior 10-15-pack-year  cigarette use, quit remotely, and aortic ectasia,, family history of premature coronary artery disease in his brother having MI at age 70Y, mild chronic dyspnea.   1. Essential (primary) hypertension Patient presently on RPM, blood pressure under excellent control.  No changes in the medications were done today.  Presently on  valsartan HCT 320/12.5 mg 1/2 tablet in the morning and hydralazine 50 mg 3 times daily.  Advised him that if his blood pressure was ever greater than 150/80 mmHg, he could always take extra dose of the hydralazine.  RPM Patient: December 2023 BP DATA Average Systolic BP Level 161.09 mmHg Lowest Systolic BP Level 88 mmHg Highest Systolic BP Level 604 mmHg  2. Stage 3a chronic kidney disease ([place]) External labs reviewed, he has been scheduled for complete physical examination and including labs including CMP and lipids by his PCP.  Renal function has remained stable.  3. Dyspnea on exertion Dyspnea on exertion is very minimal, he is trying to be as active as he can, no clinical evidence of heart failure.  4.  Internal hemorrhoids He has seen Dr. Nicki Guadalajara for GI evaluation, last colonoscopy in 2018.  He has had occasional episodes of blood in his tissue, I will send this note to GI to see whether he needs any further evaluation.  Fortunately no significant polyps and hence malignancy is low on the list.  I reassured the patient.  This was a 25-minute office visit encounter.   Adrian Prows, PA-C 10/13/2022, 10:21 AM Office: [phone]

## 2022-10-13 NOTE — Telephone Encounter (Signed)
Called and spoke with patient. Patient has been scheduled for a follow up appt with Dr. Havery Moros on Wednesday, 11/10/22 at 3:20 pm. Pt does not have his calendar at this time but he is aware that the appt information will be available in MyChart. Pt has been advised to call us back if he needs to reschedule for any reason. Pt verbalized understanding and had no concerns at the end of the call.

## 2022-10-14 DIAGNOSIS — E7800 Pure hypercholesterolemia, unspecified: Secondary | ICD-10-CM | POA: Diagnosis not present

## 2022-10-14 DIAGNOSIS — I10 Essential (primary) hypertension: Secondary | ICD-10-CM | POA: Diagnosis not present

## 2022-10-15 ENCOUNTER — Telehealth: Payer: Self-pay | Admitting: Cardiology

## 2022-10-15 NOTE — Telephone Encounter (Signed)
Patient states he had labs done yesterday (10/14/2022) at [facility]. He wants to be sure you can see these results, so he is requesting them to fax labs over to our office on Monday morning (10/18/2022), as they are closed currently. Please advise.

## 2022-10-16 DIAGNOSIS — I10 Essential (primary) hypertension: Secondary | ICD-10-CM | POA: Diagnosis not present

## 2022-10-18 NOTE — Telephone Encounter (Signed)
Please call the PCP and have them fax the labs to [place]

## 2022-10-22 ENCOUNTER — Other Ambulatory Visit: Payer: Self-pay | Admitting: Cardiology

## 2022-10-22 DIAGNOSIS — I10 Essential (primary) hypertension: Secondary | ICD-10-CM

## 2022-10-27 DIAGNOSIS — D2261 Melanocytic nevi of right upper limb, including shoulder: Secondary | ICD-10-CM | POA: Diagnosis not present

## 2022-10-27 DIAGNOSIS — Z85828 Personal history of other malignant neoplasm of skin: Secondary | ICD-10-CM | POA: Diagnosis not present

## 2022-10-27 DIAGNOSIS — D2262 Melanocytic nevi of left upper limb, including shoulder: Secondary | ICD-10-CM | POA: Diagnosis not present

## 2022-10-27 DIAGNOSIS — D2271 Melanocytic nevi of right lower limb, including hip: Secondary | ICD-10-CM | POA: Diagnosis not present

## 2022-10-27 DIAGNOSIS — L570 Actinic keratosis: Secondary | ICD-10-CM | POA: Diagnosis not present

## 2022-10-27 DIAGNOSIS — L821 Other seborrheic keratosis: Secondary | ICD-10-CM | POA: Diagnosis not present

## 2022-10-27 DIAGNOSIS — Z86018 Personal history of other benign neoplasm: Secondary | ICD-10-CM | POA: Diagnosis not present

## 2022-10-27 DIAGNOSIS — L578 Other skin changes due to chronic exposure to nonionizing radiation: Secondary | ICD-10-CM | POA: Diagnosis not present

## 2022-10-27 DIAGNOSIS — D2272 Melanocytic nevi of left lower limb, including hip: Secondary | ICD-10-CM | POA: Diagnosis not present

## 2022-10-27 DIAGNOSIS — D0439 Carcinoma in situ of skin of other parts of face: Secondary | ICD-10-CM | POA: Diagnosis not present

## 2022-10-27 DIAGNOSIS — D1724 Benign lipomatous neoplasm of skin and subcutaneous tissue of left leg: Secondary | ICD-10-CM | POA: Diagnosis not present

## 2022-10-27 DIAGNOSIS — D485 Neoplasm of uncertain behavior of skin: Secondary | ICD-10-CM | POA: Diagnosis not present

## 2022-10-27 DIAGNOSIS — D225 Melanocytic nevi of trunk: Secondary | ICD-10-CM | POA: Diagnosis not present

## 2022-11-02 DIAGNOSIS — I10 Essential (primary) hypertension: Secondary | ICD-10-CM | POA: Diagnosis not present

## 2022-11-02 DIAGNOSIS — I714 Abdominal aortic aneurysm, without rupture, unspecified: Secondary | ICD-10-CM | POA: Diagnosis not present

## 2022-11-02 DIAGNOSIS — K219 Gastro-esophageal reflux disease without esophagitis: Secondary | ICD-10-CM | POA: Diagnosis not present

## 2022-11-02 DIAGNOSIS — E7800 Pure hypercholesterolemia, unspecified: Secondary | ICD-10-CM | POA: Diagnosis not present

## 2022-11-02 DIAGNOSIS — N400 Enlarged prostate without lower urinary tract symptoms: Secondary | ICD-10-CM | POA: Diagnosis not present

## 2022-11-02 DIAGNOSIS — Z0000 Encounter for general adult medical examination without abnormal findings: Secondary | ICD-10-CM | POA: Diagnosis not present

## 2022-11-02 DIAGNOSIS — K625 Hemorrhage of anus and rectum: Secondary | ICD-10-CM | POA: Diagnosis not present

## 2022-11-02 DIAGNOSIS — I700 Atherosclerosis of aorta: Secondary | ICD-10-CM | POA: Diagnosis not present

## 2022-11-04 ENCOUNTER — Encounter: Payer: Self-pay | Admitting: Cardiology

## 2022-11-04 NOTE — Progress Notes (Signed)
Labs 10/14/2022:  Hb 14.0/HCT 41.4, platelets 278.  Serum glucose 95 mg, BUN 22, creatinine 1.34, EGFR 57 mL, potassium 4.9, LFTs normal.  Total cholesterol 139, triglycerides 60, HDL 45, LDL 82.  Non-HDL cholesterol 94.

## 2022-11-10 ENCOUNTER — Encounter: Payer: Self-pay | Admitting: Gastroenterology

## 2022-11-10 ENCOUNTER — Ambulatory Visit (INDEPENDENT_AMBULATORY_CARE_PROVIDER_SITE_OTHER): Payer: Medicare Other | Admitting: Gastroenterology

## 2022-11-10 VITALS — BP 118/60 | HR 59 | Ht 67.0 in | Wt 193.0 lb

## 2022-11-10 DIAGNOSIS — K602 Anal fissure, unspecified: Secondary | ICD-10-CM | POA: Diagnosis not present

## 2022-11-10 DIAGNOSIS — K649 Unspecified hemorrhoids: Secondary | ICD-10-CM | POA: Diagnosis not present

## 2022-11-10 MED ORDER — CALMOL-4 76-10 % RE SUPP: RECTAL | 0 refills | Status: DC

## 2022-11-10 MED ORDER — AMBULATORY NON FORMULARY MEDICATION: 0 refills | Status: DC

## 2022-11-10 NOTE — Progress Notes (Signed)
HPI :  72 year old male here for a follow-up visit for rectal bleeding.  Recall of seen him in the past for anal fissure and internal hemorrhoids.  We did a colonoscopy in 2018 for rectal bleeding which did not show any polyps.  He had anal fissures and internal hemorrhoids.  We treated this with fiber supplementation and topical ointments which improved.  He has had some recurrence of intermittent bleeding over time.  I last saw him in July 2022, he had some persistent anal fissure, recommended some diltiazem/lidocaine ointment.  Again it sounds like this helped but he has had some recurrence of scant bleeding.  He essentially has some small amount of superficial bleeding when he wipes himself with toilet paper.  Does not see any blood in the stool or toilet.  He has roughly 2 bowel movements per day.  Denies any straining, soft stools.  He is using Metamucil daily which keeps his stools soft.  Denies any rectal pain.  He denies any irritation or prolapse from hemorrhoids.  He has no family history of colon cancer.   He had a CT scan of his abdomen and pelvis since of last seen him in May 2023, did not show any concerning problems with his bowel.  Labs done earlier this month by primary care, show no evidence of anemia, normal hemoglobin.  This report was scanned into epic  Prior evaluation:  EGD 08/29/2017 - 3cm HH, mild esophagitis, 7m inflammatory nodule - biopsies benign Colonoscopy 08/29/2017 - anal fissures, posterior anal canal, moderate internal hemorrhoids, diverticulosis   Colonoscopy 01/25/2012 - normal exam, biopsies ruled out microscopic colitis, told to repeat in 10 years EGD 01/25/2012 - mild esophagitis, no evidence of Barrett's   CT scan 04/05/2016 - IMPRESSION: 1. Mild sigmoid diverticulosis. Negative for diverticulitis. No bowel mass or obstruction 2. Prostate enlargement 3. Atherosclerotic disease 4. Liver cyst slightly larger but appears benign    CT scan abdomen   pelvis 02/22/22: IMPRESSION: 1. No acute process identified. 2. Small hiatal hernia. 3. Colonic diverticulosis. 4. Prostatomegaly.     Past Medical History:  Diagnosis Date   Allergy    Anal fissure    Cancer ([place]    basal cell CA- forehead   DDD (degenerative disc disease), cervical    Hemorrhoids    Hernia, inguinal    Hiatal hernia    Hiatal hernia    IBS (irritable bowel syndrome)    Prostate atrophy 2023   Ulcer      Past Surgical History:  Procedure Laterality Date   FINGER SURGERY     Left Pinky finger   INGUINAL HERNIA REPAIR     bilateral   MOHS SURGERY     top of his head   NASAL SEPTUM SURGERY     right knee surgery     TONSILLECTOMY AND ADENOIDECTOMY     TONSILLECTOMY AND ADENOIDECTOMY     Family History  Problem Relation Age of Onset   Hypertension Mother    Heart failure Mother 865      Passed at 997  Heart disease Father    Alzheimer's disease Father 854  Heart disease Brother    Colon cancer Neg Hx    Esophageal cancer Neg Hx    Rectal cancer Neg Hx    Stomach cancer Neg Hx    Social History   Tobacco Use   Smoking status: Former    Packs/day: 1.50    Years: 15.00    Total  pack years: 22.50    Types: Cigarettes    Quit date: 10/11/1980    Years since quitting: 42.1   Smokeless tobacco: Never  Vaping Use   Vaping Use: Never used  Substance Use Topics   Alcohol use: Yes    Comment: 2-3 drinks per MONTH   Drug use: No   Current Outpatient Medications  Medication Sig Dispense Refill   hydrALAZINE (APRESOLINE) 50 MG tablet Take 1 tablet (50 mg total) by mouth 3 (three) times daily. 270 tablet 1   omeprazole (PRILOSEC OTC) 20 MG tablet Take 20 mg by mouth daily.     rosuvastatin (CRESTOR) 10 MG tablet Take 10 mg by mouth once a week.     tamsulosin (FLOMAX) 0.4 MG CAPS capsule Take 0.4 mg by mouth daily.     valsartan-hydrochlorothiazide (DIOVAN-HCT) 320-12.5 MG tablet TAKE 1 TABLET BY MOUTH EVERY DAY IN THE MORNING 90 tablet 3    esomeprazole (NEXIUM) 40 MG capsule Take 1 capsule (40 mg total) by mouth daily before lunch. (Patient not taking: Reported on 11/10/2022) 30 capsule 2   No current facility-administered medications for this visit.   No Known Allergies   Review of Systems: All systems reviewed and negative except where noted in HPI.   See labs in Epic  Physical Exam: BP 118/60   Pulse (!) 59   Ht '5\' 7"'$  (1.702 m)   Wt 193 lb (87.5 kg)   SpO2 98%   BMI 30.23 kg/m  Constitutional: Pleasant,well-developed, male in no acute distress. DRE - Tia Alert CMA as standby - small posterior midline anal fissure, inflamed LL hemorrhoid, no mass lesions - pain with DRE anoscopy not done Neurological: Alert and oriented to person place and time. Skin: Skin is warm and dry. No rashes noted. Psychiatric: Normal mood and affect. Behavior is normal.   ASSESSMENT: 72 y.o. male here for assessment of the following  1. Anal fissure   2. Hemorrhoids, unspecified hemorrhoid type    Recurrent scant bleeding with persistent small anal fissure and inflamed internal hemorrhoids.  He is using fiber daily and has soft stools.  He has no anemia, bleeding is intermittent.  He did have some pain with DRE due to the fissure.  Recommend resuming diltiazem with lidocaine ointment for several weeks to help heal the fissure and can use as needed for symptomatic recurrence.  He can use Calmol 4 suppositories for treatment of hemorrhoids as well.  I suspect his bleeding is more than likely coming from the fissure.  If it resolves with these conservative measures then would just monitor and keep stools soft with Metamucil.  If the fissure heals and he still has bleeding from hemorrhoids can consider banding but will hold off on that for now.  He had a high-quality colonoscopy a few years ago for this issue which had no polyps, I do not feel strongly he needs another colonoscopy unless he wants one for peace of mind.  He does not wish to have  one at this time. He will keep me posted and can follow up PRN for this issue.   PLAN: - continue metamucil daily - resume Diltiazem with lidocaine ointment - apply pea sized amount PR TID for a few weeks - Calmol4 suppositories samples - consider banding if symptoms persist - I think okay to hold off on colonoscopy unless he wishes to do sooner for piece of mind (no anemia, reassured him)  Jolly Mango, MD [facility]

## 2022-11-10 NOTE — Patient Instructions (Signed)
If you are age 72 or older, your body mass index should be between 23-30. Your Body mass index is 30.23 kg/m. If this is out of the aforementioned range listed, please consider follow up with your Primary Care Provider.  If you are age 7 or younger, your body mass index should be between 19-25. Your Body mass index is 30.23 kg/m. If this is out of the aformentioned range listed, please consider follow up with your Primary Care Provider.   ________________________________________________________   We have sent a prescription for diltiazem gel to [facility]. You should apply a pea size amount to your rectum three times daily x 4 weeks  [facility] information is below: Address: [address] [place], [place] [address]  [phone]  *Please DO NOT go directly from our office to pick up this medication! Give the pharmacy 1 day to process the prescription as this is compounded and takes time to make. ______________________________________________  Continue Metamucil daily  We have given you samples of the following medication to take:  Calmol 4 suppositories  Thank you for entrusting me with your care and for choosing [facility], Dr. Godwin Cellar

## 2022-11-16 DIAGNOSIS — I10 Essential (primary) hypertension: Secondary | ICD-10-CM | POA: Diagnosis not present

## 2022-11-17 DIAGNOSIS — C44329 Squamous cell carcinoma of skin of other parts of face: Secondary | ICD-10-CM | POA: Diagnosis not present

## 2022-11-25 ENCOUNTER — Other Ambulatory Visit: Payer: Self-pay | Admitting: Cardiology

## 2022-11-25 DIAGNOSIS — I10 Essential (primary) hypertension: Secondary | ICD-10-CM

## 2022-12-01 DIAGNOSIS — D239 Other benign neoplasm of skin, unspecified: Secondary | ICD-10-CM | POA: Diagnosis not present

## 2022-12-01 DIAGNOSIS — L905 Scar conditions and fibrosis of skin: Secondary | ICD-10-CM | POA: Diagnosis not present

## 2022-12-01 DIAGNOSIS — D485 Neoplasm of uncertain behavior of skin: Secondary | ICD-10-CM | POA: Diagnosis not present

## 2022-12-16 DIAGNOSIS — I10 Essential (primary) hypertension: Secondary | ICD-10-CM | POA: Diagnosis not present

## 2023-01-03 DIAGNOSIS — Z85828 Personal history of other malignant neoplasm of skin: Secondary | ICD-10-CM | POA: Diagnosis not present

## 2023-01-03 DIAGNOSIS — Z5189 Encounter for other specified aftercare: Secondary | ICD-10-CM | POA: Diagnosis not present

## 2023-01-03 DIAGNOSIS — L570 Actinic keratosis: Secondary | ICD-10-CM | POA: Diagnosis not present

## 2023-01-10 ENCOUNTER — Other Ambulatory Visit: Payer: Self-pay

## 2023-01-10 ENCOUNTER — Encounter (HOSPITAL_BASED_OUTPATIENT_CLINIC_OR_DEPARTMENT_OTHER): Payer: Self-pay

## 2023-01-10 DIAGNOSIS — Z87891 Personal history of nicotine dependence: Secondary | ICD-10-CM | POA: Insufficient documentation

## 2023-01-10 DIAGNOSIS — Z85828 Personal history of other malignant neoplasm of skin: Secondary | ICD-10-CM | POA: Insufficient documentation

## 2023-01-10 DIAGNOSIS — Z79899 Other long term (current) drug therapy: Secondary | ICD-10-CM | POA: Insufficient documentation

## 2023-01-10 DIAGNOSIS — I10 Essential (primary) hypertension: Secondary | ICD-10-CM | POA: Diagnosis not present

## 2023-01-10 NOTE — ED Triage Notes (Addendum)
POV from home, A&O x4, GCS 15, amb to triage  Takes BP at home all the time, today he forgot to take one dose of hydralazine this am. Highest BP at home was 215/99.  C/o headache all day today, took tylenol at home one hour PTA

## 2023-01-11 ENCOUNTER — Encounter (HOSPITAL_BASED_OUTPATIENT_CLINIC_OR_DEPARTMENT_OTHER): Payer: Self-pay

## 2023-01-11 ENCOUNTER — Encounter: Payer: Self-pay | Admitting: Cardiology

## 2023-01-11 ENCOUNTER — Emergency Department (HOSPITAL_BASED_OUTPATIENT_CLINIC_OR_DEPARTMENT_OTHER)
Admission: EM | Admit: 2023-01-11 | Discharge: 2023-01-11 | Disposition: A | Discharge status: Home / Self Care | Payer: Medicare Other | Attending: Emergency Medicine | Admitting: Emergency Medicine

## 2023-01-11 DIAGNOSIS — I10 Essential (primary) hypertension: Secondary | ICD-10-CM

## 2023-01-11 LAB — CBC WITH DIFFERENTIAL/PLATELET
Abs Immature Granulocytes: 0.01 10*3/uL (ref 0.00–0.07)
Basophils Absolute: 0.1 10*3/uL (ref 0.0–0.1)
Basophils Relative: 1 %
Eosinophils Absolute: 0.5 10*3/uL (ref 0.0–0.5)
Eosinophils Relative: 6 %
HCT: 40.1 % (ref 39.0–52.0)
Hemoglobin: 13.3 g/dL (ref 13.0–17.0)
Immature Granulocytes: 0 %
Lymphocytes Relative: 26 %
Lymphs Abs: 2.1 10*3/uL (ref 0.7–4.0)
MCH: 30.4 pg (ref 26.0–34.0)
MCHC: 33.2 g/dL (ref 30.0–36.0)
MCV: 91.8 fL (ref 80.0–100.0)
Monocytes Absolute: 1 10*3/uL (ref 0.1–1.0)
Monocytes Relative: 13 %
Neutro Abs: 4.2 10*3/uL (ref 1.7–7.7)
Neutrophils Relative %: 54 %
Platelets: 195 10*3/uL (ref 150–400)
RBC: 4.37 MIL/uL (ref 4.22–5.81)
RDW: 12.9 % (ref 11.5–15.5)
WBC: 7.8 10*3/uL (ref 4.0–10.5)
nRBC: 0 % (ref 0.0–0.2)

## 2023-01-11 LAB — BASIC METABOLIC PANEL
Anion gap: 9 (ref 5–15)
BUN: 25 mg/dL — ABNORMAL HIGH (ref 8–23)
CO2: 21 mmol/L — ABNORMAL LOW (ref 22–32)
Calcium: 8.9 mg/dL (ref 8.9–10.3)
Chloride: 107 mmol/L (ref 98–111)
Creatinine, Ser: 1.18 mg/dL (ref 0.61–1.24)
GFR, Estimated: >60 mL/min (ref >60)
Glucose, Bld: 119 mg/dL — ABNORMAL HIGH (ref 70–99)
Potassium: 4.3 mmol/L (ref 3.5–5.1)
Sodium: 137 mmol/L (ref 135–145)

## 2023-01-11 LAB — TROPONIN I (HIGH SENSITIVITY): Troponin I (High Sensitivity): 6 ng/L (ref <18)

## 2023-01-11 MED ORDER — EPLERENONE 25 MG PO TABS: 25.0000 mg | ORAL_TABLET | ORAL | 2 refills | Status: DC

## 2023-01-11 NOTE — Telephone Encounter (Signed)
ICD-10-CM   1. Essential (primary) hypertension  I10 eplerenone (INSPRA) 25 MG tablet    Basic metabolic panel     Orders Placed This Encounter  Procedures   Basic metabolic panel    Meds ordered this encounter  Medications   eplerenone (INSPRA) 25 MG tablet    Sig: Take 1 tablet (25 mg total) by mouth every morning.    Dispense:  30 tablet    Refill:  2   Developed mild renal insufficiency with spironolactone in 2021 and had discontinued this.  However I would like to try eplerenone 25 mg daily, will obtain BMP in 2 weeks.

## 2023-01-11 NOTE — ED Provider Notes (Signed)
[facility] Provider Note: Georgena Spurling, MD, FACEP  CSN: [id number] MRN: [id number] ARRIVAL: 01/10/23 at 2343 ROOM: DB010/DB010   CHIEF COMPLAINT  Hypertension   HISTORY OF PRESENT ILLNESS  01/11/23 2:09 AM John Bird is a 72 y.o. male with a history of hyper tension that has been well-controlled on his home medications.  He checks his blood pressure frequently and for the past 3 days it has been elevated, as high as 215/99.  He had a headache all day for yesterday which he took Tylenol 1 hour prior to arrival which relieved it and he has no headache at the present time.  He denies any visual changes, focal neurologic changes, chest pain or shortness of breath.   Past Medical History:  Diagnosis Date   Allergy    Anal fissure    Cancer    basal cell CA- forehead   DDD (degenerative disc disease), cervical    Hemorrhoids    Hernia, inguinal    Hiatal hernia    Hiatal hernia    Hypertension    IBS (irritable bowel syndrome)    Prostate atrophy 2023   Ulcer     Past Surgical History:  Procedure Laterality Date   FINGER SURGERY     Left Pinky finger   INGUINAL HERNIA REPAIR     bilateral   MOHS SURGERY     top of his head   NASAL SEPTUM SURGERY     right knee surgery     TONSILLECTOMY AND ADENOIDECTOMY     TONSILLECTOMY AND ADENOIDECTOMY      Family History  Problem Relation Age of Onset   Hypertension Mother    Heart failure Mother 19       Passed at 96   Heart disease Father    Alzheimer's disease Father 71   Heart disease Brother    Colon cancer Neg Hx    Esophageal cancer Neg Hx    Rectal cancer Neg Hx    Stomach cancer Neg Hx     Social History   Tobacco Use   Smoking status: Former    Packs/day: 1.50    Years: 15.00    Additional pack years: 0.00    Total pack years: 22.50    Types: Cigarettes    Quit date: 10/11/1980    Years since quitting: 42.2   Smokeless tobacco: Never  Vaping Use   Vaping Use: Never used  Substance Use  Topics   Alcohol use: Yes    Comment: 2-3 drinks per MONTH   Drug use: No    Prior to Admission medications   Medication Sig Start Date End Date Taking? Authorizing Provider  AMBULATORY NON FORMULARY MEDICATION Diltiazem gel with 2% lidocaine  Apply a pea sized amount into your rectum three times daily for weeks or until fissure is healed Dispense 30 GM zero refill 11/10/22   Armbruster, Carlota Raspberry, MD  esomeprazole (NEXIUM) 40 MG capsule Take 1 capsule (40 mg total) by mouth daily before lunch. Patient not taking: Reported on 11/10/2022 05/10/22   Adrian Prows, MD  hydrALAZINE (APRESOLINE) 50 MG tablet TAKE 1 TABLET BY MOUTH THREE TIMES A DAY 11/25/22   Adrian Prows, MD  omeprazole (PRILOSEC OTC) 20 MG tablet Take 20 mg by mouth daily.    [provider]  Rectal Protectant-Emollient (CALMOL-4) 76-10 % SUPP Use as directed once to twice daily 11/10/22   Armbruster, Carlota Raspberry, MD  rosuvastatin (CRESTOR) 10 MG tablet Take 10 mg by mouth  once a week.    [provider]  tamsulosin (FLOMAX) 0.4 MG CAPS capsule Take 0.4 mg by mouth daily. 11/02/22   [provider]  valsartan-hydrochlorothiazide (DIOVAN-HCT) 320-12.5 MG tablet TAKE 1 TABLET BY MOUTH EVERY DAY IN THE MORNING 10/22/22   Adrian Prows, MD    Allergies Patient has no known allergies.   REVIEW OF SYSTEMS  Negative except as noted here or in the History of Present Illness.   PHYSICAL EXAMINATION  Initial Vital Signs Blood pressure (!) 181/82, pulse (!) 52, temperature 98.2 F (36.8 C), resp. rate 18, height 5\' 7"  (1.702 m), weight 87.5 kg, SpO2 98 %.  Examination General: Well-developed, well-nourished male in no acute distress; appearance consistent with age of record HENT: normocephalic; atraumatic Eyes: pupils equal, round and reactive to light; extraocular muscles intact Neck: supple Heart: regular rate and rhythm Lungs: clear to auscultation bilaterally Abdomen: soft; nondistended; nontender; bowel sounds  present Extremities: No deformity; full range of motion; pulses normal Neurologic: Awake, alert and oriented; motor function intact in all extremities and symmetric; no facial droop Skin: Warm and dry Psychiatric: Normal mood and affect   RESULTS  Summary of this visit's results, reviewed and interpreted by myself:   EKG Interpretation  Date/Time:  Monday January 10 2023 23:59:08 EDT Ventricular Rate:  54 PR Interval:  178 QRS Duration: 78 QT Interval:  426 QTC Calculation: 403 R Axis:   -31 Text Interpretation: Sinus bradycardia Left axis deviation Abnormal ECG No significant change was found Confirmed by Jihad Brownlow, Jenny Reichmann [phone]) on 01/11/2023 12:05:02 AM       Laboratory Studies: Results for orders placed or performed during the hospital encounter of 01/11/23 (from the past 24 hour(s))  CBC with Differential     Status: None   Collection Time: 01/11/23 12:04 AM  Result Value Ref Range   WBC 7.8 4.0 - 10.5 K/uL   RBC 4.37 4.22 - 5.81 MIL/uL   Hemoglobin 13.3 13.0 - 17.0 g/dL   HCT 40.1 39.0 - 52.0 %   MCV 91.8 80.0 - 100.0 fL   MCH 30.4 26.0 - 34.0 pg   MCHC 33.2 30.0 - 36.0 g/dL   RDW 12.9 11.5 - 15.5 %   Platelets 195 150 - 400 K/uL   nRBC 0.0 0.0 - 0.2 %   Neutrophils Relative % 54 %   Neutro Abs 4.2 1.7 - 7.7 K/uL   Lymphocytes Relative 26 %   Lymphs Abs 2.1 0.7 - 4.0 K/uL   Monocytes Relative 13 %   Monocytes Absolute 1.0 0.1 - 1.0 K/uL   Eosinophils Relative 6 %   Eosinophils Absolute 0.5 0.0 - 0.5 K/uL   Basophils Relative 1 %   Basophils Absolute 0.1 0.0 - 0.1 K/uL   Immature Granulocytes 0 %   Abs Immature Granulocytes 0.01 0.00 - 0.07 K/uL  Basic metabolic panel     Status: Abnormal   Collection Time: 01/11/23 12:04 AM  Result Value Ref Range   Sodium 137 135 - 145 mmol/L   Potassium 4.3 3.5 - 5.1 mmol/L   Chloride 107 98 - 111 mmol/L   CO2 21 (L) 22 - 32 mmol/L   Glucose, Bld 119 (H) 70 - 99 mg/dL   BUN 25 (H) 8 - 23 mg/dL   Creatinine, Ser 1.18 0.61 -  1.24 mg/dL   Calcium 8.9 8.9 - 10.3 mg/dL   GFR, Estimated >60 >60 mL/min   Anion gap 9 5 - 15  Troponin I (High Sensitivity)  Status: None   Collection Time: 01/11/23 12:04 AM  Result Value Ref Range   Troponin I (High Sensitivity) 6 <18 ng/L   Imaging Studies: No results found.  ED COURSE and MDM  Nursing notes, initial and subsequent vitals signs, including pulse oximetry, reviewed and interpreted by myself.  Vitals:   01/11/23 0013 01/11/23 0030 01/11/23 0100 01/11/23 0200  BP: (!) 203/87 (!) 176/86 (!) 181/82 (!) 196/91  Pulse:  (!) 51 (!) 52 (!) 48  Resp: 18   18  Temp:      SpO2: 100% 99% 98% 99%  Weight:      Height:       Medications - No data to display  The patient is currently asymptomatic and is showing no evidence of acute endorgan damage.  His cardiologist is Dr. Adrian Prows.  We will have him contact Dr. Einar Gip later this morning regarding any possible medication changes for better control of his blood pressure.  I will send Dr. Einar Gip a note as well.  PROCEDURES  Procedures   ED DIAGNOSES     ICD-10-CM   1. Hypertension not at goal  I10          [place] Whedbee, Jenny Reichmann, MD 01/11/23 0221

## 2023-01-11 NOTE — Telephone Encounter (Signed)
From patient.

## 2023-01-13 ENCOUNTER — Ambulatory Visit: Payer: PRIVATE HEALTH INSURANCE | Admitting: Cardiology

## 2023-01-14 ENCOUNTER — Telehealth: Payer: Self-pay

## 2023-01-14 ENCOUNTER — Ambulatory Visit: Payer: Medicare Other | Admitting: Cardiology

## 2023-01-14 ENCOUNTER — Encounter: Payer: Self-pay | Admitting: Cardiology

## 2023-01-14 VITALS — BP 162/64 | HR 71 | Ht 67.0 in | Wt 194.2 lb

## 2023-01-14 DIAGNOSIS — I10 Essential (primary) hypertension: Secondary | ICD-10-CM

## 2023-01-14 DIAGNOSIS — N1831 Chronic kidney disease, stage 3a: Secondary | ICD-10-CM | POA: Diagnosis not present

## 2023-01-14 DIAGNOSIS — I129 Hypertensive chronic kidney disease with stage 1 through stage 4 chronic kidney disease, or unspecified chronic kidney disease: Secondary | ICD-10-CM | POA: Diagnosis not present

## 2023-01-14 DIAGNOSIS — R0609 Other forms of dyspnea: Secondary | ICD-10-CM

## 2023-01-14 NOTE — Telephone Encounter (Signed)
     Patient  visit on 4/2  at [facility]    Have you been able to follow up with your primary care physician? Yes   The patient was or was not able to obtain any needed medicine or equipment. Yes   Are there diet recommendations that you are having difficulty following? Na   Patient expresses understanding of discharge instructions and education provided has no other needs at this time. Yes      John Bird [facility], [place] [phone] [address], [place], [place] [address] Phone: [phone] Email: [email or web address]    

## 2023-01-14 NOTE — Progress Notes (Signed)
Primary Physician/Referring:  Fatima Sanger, FNP  Patient ID: John Bird, male    DOB: [birth year], 72 y.o.   MRN: [id number]  Chief Complaint  Patient presents with   Hypertension   Hospitalization Follow-up         HPI: JOSHAUA Bird  is a 72 y.o. male Caucasian  male  with chronic asymptomatic sinus bradycardia, hyperlipidemia,  prior 10-15-pack-year  cigarette use, quit remotely, mild aortic ectasia, family history of premature coronary artery disease in his brother having MI at age 35Y, chronic stable angina and DOE with nuclear stress test on 09/17/2018 revealing mild inferior ischemia.  Patient is here for follow-up of hypertension, presented to the emergency room on 01/09/2022.  Continues to be physically active without any chest pain per se, dyspnea is remained stable although he has developed mild congestion in view of allergies that are seasonal.  On further questioning, patient states that as his blood pressure was very well-controlled he thought that he could wean himself off of valsartan HCT and about 2 to 3 weeks ago he stopped the valsartan HCT.  Past Medical History:  Diagnosis Date   Allergy    Anal fissure    Cancer    basal cell CA- forehead   DDD (degenerative disc disease), cervical    Hemorrhoids    Hernia, inguinal    Hiatal hernia    Hiatal hernia    Hypertension    IBS (irritable bowel syndrome)    Prostate atrophy 2023   Ulcer    Past Surgical History:  Procedure Laterality Date   FINGER SURGERY     Left Pinky finger   INGUINAL HERNIA REPAIR     bilateral   MOHS SURGERY     top of his head   NASAL SEPTUM SURGERY     right knee surgery     TONSILLECTOMY AND ADENOIDECTOMY     TONSILLECTOMY AND ADENOIDECTOMY     Social History   Tobacco Use   Smoking status: Former    Packs/day: 1.50    Years: 15.00    Additional pack years: 0.00    Total pack years: 22.50    Types: Cigarettes    Quit date: 10/11/1980    Years since quitting: 42.2    Smokeless tobacco: Never  Substance Use Topics   Alcohol use: Yes    Comment: 2-3 drinks per MONTH   Marital Status: Married   Review of Systems  Cardiovascular:  Positive for dyspnea on exertion (stable). Negative for chest pain and leg swelling.  Gastrointestinal:  Negative for melena.        01/14/2023   10:14 AM 01/14/2023   10:09 AM 01/11/2023    2:00 AM  Vitals with BMI  Height  5\' 7"    Weight  194 lbs 3 oz   BMI  30.41   Systolic 162 173 409  Diastolic 64 62 91  Pulse 71 72 48    Physical Exam Vitals reviewed.  Neck:     Thyroid: No thyromegaly.     Vascular: No JVD.  Cardiovascular:     Rate and Rhythm: Normal rate and regular rhythm.     Pulses: Normal pulses and intact distal pulses.     Heart sounds: Normal heart sounds. No murmur heard.    No gallop.  Pulmonary:     Effort: Pulmonary effort is normal.     Breath sounds: Normal breath sounds.  Abdominal:     General: Bowel sounds are  normal.     Palpations: Abdomen is soft.     Tenderness: There is no abdominal tenderness.  Musculoskeletal:     Right lower leg: No edema.     Left lower leg: No edema.    Laboratory examination:   Lab Results  Component Value Date   NA 137 01/11/2023   K 4.3 01/11/2023   CO2 21 (L) 01/11/2023   GLUCOSE 119 (H) 01/11/2023   BUN 25 (H) 01/11/2023   CREATININE 1.18 01/11/2023   CALCIUM 8.9 01/11/2023   EGFR 59 (L) 07/21/2021   GFRNONAA >60 01/11/2023       Latest Ref Rng & Units 01/11/2023   12:04 AM 02/22/2022   11:37 AM 07/21/2021   11:39 AM  CMP  Glucose 70 - 99 mg/dL 161119  97  89   BUN 8 - 23 mg/dL 25  21  20    Creatinine 0.61 - 1.24 mg/dL 0.961.18  0.451.27  4.091.30   Sodium 135 - 145 mmol/L 137  139  140   Potassium 3.5 - 5.1 mmol/L 4.3  4.2  4.7   Chloride 98 - 111 mmol/L 107  107  104   CO2 22 - 32 mmol/L 21  24  25    Calcium 8.9 - 10.3 mg/dL 8.9  9.2  8.8   Total Protein 6.5 - 8.1 g/dL  7.2    Total Bilirubin 0.3 - 1.2 mg/dL  0.9    Alkaline Phos 38 - 126 U/L   76    AST 15 - 41 U/L  20    ALT 0 - 44 U/L  16        Latest Ref Rng & Units 01/11/2023   12:04 AM 02/22/2022   11:37 AM 09/30/2020   12:50 PM  CBC  WBC 4.0 - 10.5 K/uL 7.8  6.3  4.1   Hemoglobin 13.0 - 17.0 g/dL 81.113.3  91.413.0  78.213.5   Hematocrit 39.0 - 52.0 % 40.1  39.5  40.4   Platelets 150 - 400 K/uL 195  248  197     External labs:   Labs 10/14/2022:  Hb 14.0/HCT 41.4, platelets 278.  Serum glucose 95 mg, BUN 22, creatinine 1.34, EGFR 57 mL, potassium 4.9, LFTs normal.  Total cholesterol 139, triglycerides 60, HDL 45, LDL 82.  Non-HDL cholesterol 94.  Labs 10/30/2021:  Total cholesterol 139, triglycerides 60, HDL 45, LDL 82.  TSH normal at 2.03.  Hb 13.0/HCT 39.1, platelets 248, normal indicis.  Sodium 141, potassium 4.5, BUN 20, creatinine 1.25, EGFR 58 mL.  Radiology    CT abdomen with contrast 02/22/2022: 1. No acute process identified. 2. Small hiatal hernia. 3. Colonic diverticulosis. 4. Prostatomegaly 5. Aortic atherosclerosis. No enlarged abdominal or pelvic lymph nodes.  Cardiac Studies:   Lexiscan Sestamibi stress test 09/15/2018: 1. Lexiscan stress test was performed. Exercise capacity was not assessed. Stress symptoms included chest pressure, dyspnea, syncope. Resting blood pressure 138/82 mmHg, dropped to peak effect blood pressure of 82/40 mmHg. The resting and stress electrocardiogram demonstrated normal sinus rhythm, normal resting conduction, no resting arrhythmias and normal rest repolarization. Stress EKG is non diagnostic for ischemia as it is a pharmacologic stress. 2. The overall quality of the study is good. Left ventricular cavity is noted to be normal on the rest and stress studies. Gated SPECT images reveal normal myocardial thickening and wall motion. The left ventricular ejection fraction was calculated or visually estimated to be 68%. SPECT images reveal small size, mild intensity, reversible perfusion  defect in basal inferior myocardium suggestive  of ischemia. 3. Low risk study.  Abdominal Aortic Duplex 09/12/2020: The maximum aorta (sac) diameter is 2.33 cm (dist) with minimal ectasia in the distal aorta. Moderate heterogeneous plaque observed in the proximal, mid and distal aorta.  Normal flow velocities noted in the iliac vessels proximally.  No AAA noted. Compared to 09/19/2018, 3 cm AAA not present and probably was a measurement error previously.  PCV ECHOCARDIOGRAM COMPLETE 08/03/2021  Narrative Echocardiogram 08/03/2021: Left ventricle cavity is normal in size and wall thickness. Normal global wall motion. Normal LV systolic function with EF 69%. Doppler evidence of grade I (impaired) diastolic dysfunction, normal LAP. Left atrial cavity is mildly dilated. Aneurysmal interatrial septum without 2D or color Doppler evidence of shunting. Structurally normal trileaflet aortic valve. No evidence of aortic stenosis. Mild (Grade I) aortic regurgitation. Estimated right atrial pressure 8 mmHg. Previous study in 2019 reported grade 2 DD, mild MR-not appreciated on this study.    EKG   EKG 01/10/2023: Normal sinus rhythm at rate of 54 bpm.  Normal EKG.  No significant change from 10/13/2022.  Allergies and medications   No Known Allergies    Current Outpatient Medications:    AMBULATORY NON FORMULARY MEDICATION, Diltiazem gel with 2% lidocaine  Apply a pea sized amount into your rectum three times daily for weeks or until fissure is healed Dispense 30 GM zero refill, Disp: 30 g, Rfl: 0   hydrALAZINE (APRESOLINE) 50 MG tablet, TAKE 1 TABLET BY MOUTH THREE TIMES A DAY, Disp: 270 tablet, Rfl: 1   omeprazole (PRILOSEC OTC) 20 MG tablet, Take 20 mg by mouth daily. mwf, Disp: , Rfl:    Rectal Protectant-Emollient (CALMOL-4) 76-10 % SUPP, Use as directed once to twice daily, Disp: 6 suppository, Rfl: 0   rosuvastatin (CRESTOR) 10 MG tablet, Take 10 mg by mouth once a week., Disp: , Rfl:    tamsulosin (FLOMAX) 0.4 MG CAPS capsule, Take 0.4  mg by mouth daily., Disp: , Rfl:    valsartan-hydrochlorothiazide (DIOVAN-HCT) 320-12.5 MG tablet, TAKE 1 TABLET BY MOUTH EVERY DAY IN THE MORNING, Disp: 90 tablet, Rfl: 3   Assessment     ICD-10-CM   1. Essential (primary) hypertension  I10 valsartan-hydrochlorothiazide (DIOVAN-HCT) 320-12.5 MG tablet    2. Stage 3a chronic kidney disease  N18.31     3. Dyspnea on exertion  R06.09       Medications Discontinued During This Encounter  Medication Reason   valsartan-hydrochlorothiazide (DIOVAN-HCT) 320-12.5 MG tablet Patient Preference   eplerenone (INSPRA) 25 MG tablet Patient has not taken in last 30 days     No orders of the defined types were placed in this encounter.  Recommendations:   ABHIJEET CLEAVENGER  is a 72 y.o. Caucasian  male  with chronic asymptomatic sinus bradycardia, hyperlipidemia,  prior 10-15-pack-year  cigarette use, quit remotely, and aortic ectasia,, family history of premature coronary artery disease in his brother having MI at age 65Y, mild chronic dyspnea.   Patient is here for follow-up of hypertension, presented to the emergency room on 01/09/2022.  1. Essential (primary) hypertension Patient had discontinued taking valsartan, previously his blood pressure was under excellent control on valsartan HCT 320/12.5 mg daily.  He did develop mild renal insufficiency at stage IIIa chronic any disease with valsartan but had remained stable.  I have advised him to restart the medication.  I had also sent in a prescription for Inspra not knowing that he has discontinued valsartan, he has  not started the medication, will hold off on starting the Inspra for now.  He will get BMP in about 2 weeks.  He plans to travel to [place] in [place], advised him to keep himself well-hydrated.  He is on RPM for hypertension management, will continue to monitor this closely. I also advised him that while he is traveling, he can take hydralazine up to 100 mg 3-4 times a day if necessary.  I  have reassured him that with longstanding hypertension, the chances of having hypertensive emergency would be very low and that he should not get too excited about one-time elevation in blood pressure without any symptoms of chest pain or headache or nausea or vomiting.  I reviewed the data from the emergency room and the EKG.  - valsartan-hydrochlorothiazide (DIOVAN-HCT) 320-12.5 MG tablet; TAKE 1 TABLET BY MOUTH EVERY DAY IN THE MORNING  Dispense: 90 tablet; Refill: 3  2. Stage 3a chronic kidney disease Patient has stage IIIa chronic kidney disease, this has remained stable.  Beneficial effects of ARB discussed with the patient.  3. Dyspnea on exertion Dyspnea stable, he has allergies that are seasonal and is congested.  Will see him back in 6 months for follow-up.  RPM Patient: January 2024 BP DATA Systolic Blood Pressure 141.5 (108.0 - 170.0) Diastolic Blood Pressure 69.7 (91.451.0 - 96.0) Heart Rate 63.9 (49.0 - 77.0)  Feb 2024 Systolic Blood Pressure 141.0 (105.0 - 170.0) Diastolic Blood Pressure 67.3 (78.247.0 - 96.0) Heart Rate 64.6 (52.0 - 77.0)  Mar 2024 Systolic Blood Pressure 144.0 (105.0 - 191.0) Diastolic Blood Pressure 68.8 (95.647.0 - 96.0) Heart Rate 63.8 (52.0 - 80.0)    Yates DecampJay Kaj Vasil, PA-C 01/14/2023, 10:57 AM Office: [phone]

## 2023-01-14 NOTE — Telephone Encounter (Signed)
He had discontinued Valsartan 320/12.5 2-3 weeks ago. That was the issue. This has been addressed

## 2023-01-14 NOTE — Telephone Encounter (Signed)
Patient blood pressure remains elevated but does fluctuate to normal readings.  Patient will be going to [place] starting 4/23 through the beginning of May.  14-day summaries Systolic (AM) mmHg --  149.8 (794.8 - 188.0)   Diastolic (AM) mmHg --  72.5 (57.0 - 85.0)   Heart Rate (AM) bpm --  64.5 (54.0 - 79.0)    Systolic (PM) mmHg --  177.3 (016.5 - 215.0)   Diastolic (PM) mmHg --  81.1 (61.0 - 99.0)   Heart Rate (PM) bpm -- 61.7 (52.0 - 74.0)

## 2023-01-15 DIAGNOSIS — I10 Essential (primary) hypertension: Secondary | ICD-10-CM | POA: Diagnosis not present

## 2023-01-25 DIAGNOSIS — I10 Essential (primary) hypertension: Secondary | ICD-10-CM | POA: Diagnosis not present

## 2023-01-26 ENCOUNTER — Encounter: Payer: Self-pay | Admitting: Cardiology

## 2023-01-26 LAB — BASIC METABOLIC PANEL
BUN/Creatinine Ratio: 16 (ref 10–24)
BUN: 22 mg/dL (ref 8–27)
CO2: 22 mmol/L (ref 20–29)
Calcium: 8.9 mg/dL (ref 8.6–10.2)
Chloride: 103 mmol/L (ref 96–106)
Creatinine, Ser: 1.39 mg/dL — ABNORMAL HIGH (ref 0.76–1.27)
Glucose: 94 mg/dL (ref 70–99)
Potassium: 4.8 mmol/L (ref 3.5–5.2)
Sodium: 138 mmol/L (ref 134–144)
eGFR: 54 mL/min/{1.73_m2} — ABNORMAL LOW (ref >59)

## 2023-01-26 NOTE — Telephone Encounter (Signed)
From patient.

## 2023-01-27 NOTE — Progress Notes (Signed)
BP well controlled and hence will continue present medications and trend the creatinine

## 2023-01-31 DIAGNOSIS — Z85828 Personal history of other malignant neoplasm of skin: Secondary | ICD-10-CM | POA: Diagnosis not present

## 2023-01-31 DIAGNOSIS — Z5189 Encounter for other specified aftercare: Secondary | ICD-10-CM | POA: Diagnosis not present

## 2023-02-14 DIAGNOSIS — I10 Essential (primary) hypertension: Secondary | ICD-10-CM | POA: Diagnosis not present

## 2023-02-19 ENCOUNTER — Emergency Department (HOSPITAL_BASED_OUTPATIENT_CLINIC_OR_DEPARTMENT_OTHER)
Admission: EM | Admit: 2023-02-19 | Discharge: 2023-02-19 | Disposition: A | Discharge status: Home / Self Care | Payer: Medicare Other | Attending: Emergency Medicine | Admitting: Emergency Medicine

## 2023-02-19 ENCOUNTER — Other Ambulatory Visit: Payer: Self-pay

## 2023-02-19 ENCOUNTER — Encounter (HOSPITAL_BASED_OUTPATIENT_CLINIC_OR_DEPARTMENT_OTHER): Payer: Self-pay

## 2023-02-19 DIAGNOSIS — U071 COVID-19: Secondary | ICD-10-CM | POA: Diagnosis not present

## 2023-02-19 DIAGNOSIS — I10 Essential (primary) hypertension: Secondary | ICD-10-CM | POA: Insufficient documentation

## 2023-02-19 DIAGNOSIS — R059 Cough, unspecified: Secondary | ICD-10-CM | POA: Diagnosis present

## 2023-02-19 MED ORDER — PAXLOVID (300/100) 20 X 150 MG & 10 X 100MG PO TBPK
3.0000 | ORAL_TABLET | Freq: Two times a day (BID) | ORAL | 0 refills | Status: AC
Start: 2023-02-19 — End: 2023-02-24

## 2023-02-19 MED ORDER — MOLNUPIRAVIR EUA 200MG CAPSULE: 4.0000 | ORAL_CAPSULE | Freq: Two times a day (BID) | ORAL | 0 refills | Status: DC

## 2023-02-19 NOTE — ED Triage Notes (Signed)
He states he has had cough/aches since this Tues., most of which have resolved. Took home COVID test which was "positive". He is ambulatory and in no distress.

## 2023-02-19 NOTE — ED Provider Notes (Signed)
[place] EMERGENCY DEPARTMENT AT [facility] Provider Note   CSN: [id number] Arrival date & time: 02/19/23  3086     History  Chief Complaint  Patient presents with   URI    John Bird is a 72 y.o. male.  Patient is a 72 year old male with a past medical history of hypertension, hyperlipidemia, GERD and BPH presenting to the emergency department with cough and congestion.  The patient states that he just flew back from [place] on Monday and on Tuesday started to develop a mild cough with congestion.  He states that he had 1 day of fever that has since resolved.  He denies any chest pain or shortness of breath, nausea, vomiting or diarrhea.  He states that he tested himself this morning for COVID and it was positive.  The history is provided by the patient.  URI      Home Medications Prior to Admission medications   Medication Sig Start Date End Date Taking? Authorizing Provider  molnupiravir EUA (LAGEVRIO) 200 mg CAPS capsule Take 4 capsules (800 mg total) by mouth 2 (two) times daily for 5 days. 02/19/23 02/24/23 Yes Theresia Lo, Benetta Spar K, DO  AMBULATORY NON FORMULARY MEDICATION Diltiazem gel with 2% lidocaine  Apply a pea sized amount into your rectum three times daily for weeks or until fissure is healed Dispense 30 GM zero refill 11/10/22   Armbruster, Willaim Rayas, MD  hydrALAZINE (APRESOLINE) 50 MG tablet TAKE 1 TABLET BY MOUTH THREE TIMES A DAY 11/25/22   Yates Decamp, MD  omeprazole (PRILOSEC OTC) 20 MG tablet Take 20 mg by mouth daily. mwf    [provider]  Rectal Protectant-Emollient (CALMOL-4) 76-10 % SUPP Use as directed once to twice daily 11/10/22   Armbruster, Willaim Rayas, MD  rosuvastatin (CRESTOR) 10 MG tablet Take 10 mg by mouth once a week.    [provider]  tamsulosin (FLOMAX) 0.4 MG CAPS capsule Take 0.4 mg by mouth daily. 11/02/22   [provider]  valsartan-hydrochlorothiazide (DIOVAN-HCT) 320-12.5 MG tablet TAKE 1 TABLET BY  MOUTH EVERY DAY IN THE MORNING 01/14/23   Yates Decamp, MD      Allergies    Patient has no known allergies.    Review of Systems   Review of Systems  Physical Exam Updated Vital Signs BP (!) 137/59 (BP Location: Right Arm)   Pulse 75   Temp 98.5 F (36.9 C) (Oral)   Resp 18   SpO2 97%  Physical Exam Vitals and nursing note reviewed.  Constitutional:      General: He is not in acute distress.    Appearance: Normal appearance.  HENT:     Head: Normocephalic and atraumatic.     Nose: Nose normal.     Mouth/Throat:     Mouth: Mucous membranes are moist.     Pharynx: Oropharynx is clear.  Eyes:     Extraocular Movements: Extraocular movements intact.     Conjunctiva/sclera: Conjunctivae normal.  Cardiovascular:     Rate and Rhythm: Normal rate and regular rhythm.     Heart sounds: Normal heart sounds.  Pulmonary:     Effort: Pulmonary effort is normal.     Breath sounds: Normal breath sounds.  Abdominal:     General: Abdomen is flat.     Palpations: Abdomen is soft.     Tenderness: There is no abdominal tenderness.  Musculoskeletal:        General: Normal range of motion.     Cervical back:  Normal range of motion and neck supple.  Skin:    General: Skin is warm and dry.  Neurological:     General: No focal deficit present.     Mental Status: He is alert and oriented to person, place, and time.  Psychiatric:        Mood and Affect: Mood normal.        Behavior: Behavior normal.     ED Results / Procedures / Treatments   Labs (all labs ordered are listed, but only abnormal results are displayed) Labs Reviewed - No data to display  EKG None  Radiology No results found.  Procedures Procedures    Medications Ordered in ED Medications - No data to display  ED Course/ Medical Decision Making/ A&P                             Medical Decision Making This patient presents to the ED with chief complaint(s) of cough, congestion with past medical history of  hypertension, hyperlipidemia, GERD, BPH which further complicates the presenting complaint. The complaint involves an extensive differential diagnosis and also carries with it a high risk of complications and morbidity.    The differential diagnosis includes viral syndrome, patient has no focal lung sounds and is satting well on room air making pneumonia unlikely, patient's uvula is midline is no trismus is tolerating secretions and has normal phonation making RPA, PTA or Ludwig's unlikely, patient has no signs of severe dehydration on exam   Additional history obtained: Additional history obtained from N/A Records reviewed outpatient cardiology records  ED Course and Reassessment: Patient tested positive for COVID at home and is not need to repeat testing here.  Due to his age and medical history he will be treated with antiviral.  He has no signs of pneumonia on exam and is satting well on room air.  They recommended Tylenol and Motrin further fevers and bodyaches.  I recommended primary care follow-up and were given strict return precautions.  Independent labs interpretation:  N/A  Independent visualization of imaging: N/A  Consultation: - Consulted or discussed management/test interpretation w/ external professional: N/A  Consideration for admission or further workup: Patient has no emergent conditions requiring admission or further work-up at this time and is stable for discharge home with primary care follow-up  Social Determinants of health: N/A          Final Clinical Impression(s) / ED Diagnoses Final diagnoses:  COVID-19    Rx / DC Orders ED Discharge Orders          Ordered    molnupiravir EUA (LAGEVRIO) 200 mg CAPS capsule  2 times daily        02/19/23 0911              Rexford Maus, DO 02/19/23 [phone]

## 2023-02-19 NOTE — Discharge Instructions (Signed)
You were seen in the emergency department for your cough and congestion.  You tested positive for COVID at home and will be treated with an antiviral medication and can continue symptomatic treatment with Tylenol and Motrin as needed for fevers and bodyaches and both can be taken up to every 6 hours.  You can try Mucinex or raw honey as needed for your cough and you can use a humidifier or hot steam from the shower to help with your congestion as well as over-the-counter and nasal decongestant sprays.  You can follow-up with your primary doctor in the next few days to have your symptoms rechecked.  You should return to the emergency department for significantly worsening shortness of breath, severe chest pain, repetitive vomiting or if you have any other new or concerning symptoms.

## 2023-02-25 DIAGNOSIS — H53143 Visual discomfort, bilateral: Secondary | ICD-10-CM | POA: Diagnosis not present

## 2023-02-25 DIAGNOSIS — H5203 Hypermetropia, bilateral: Secondary | ICD-10-CM | POA: Diagnosis not present

## 2023-02-25 DIAGNOSIS — H52223 Regular astigmatism, bilateral: Secondary | ICD-10-CM | POA: Diagnosis not present

## 2023-02-25 DIAGNOSIS — H524 Presbyopia: Secondary | ICD-10-CM | POA: Diagnosis not present

## 2023-02-25 DIAGNOSIS — H2513 Age-related nuclear cataract, bilateral: Secondary | ICD-10-CM | POA: Diagnosis not present

## 2023-02-25 DIAGNOSIS — H35033 Hypertensive retinopathy, bilateral: Secondary | ICD-10-CM | POA: Diagnosis not present

## 2023-02-25 DIAGNOSIS — I10 Essential (primary) hypertension: Secondary | ICD-10-CM | POA: Diagnosis not present

## 2023-03-01 DIAGNOSIS — N400 Enlarged prostate without lower urinary tract symptoms: Secondary | ICD-10-CM | POA: Diagnosis not present

## 2023-03-01 DIAGNOSIS — R7301 Impaired fasting glucose: Secondary | ICD-10-CM | POA: Diagnosis not present

## 2023-03-01 DIAGNOSIS — Z09 Encounter for follow-up examination after completed treatment for conditions other than malignant neoplasm: Secondary | ICD-10-CM | POA: Diagnosis not present

## 2023-03-01 DIAGNOSIS — E7800 Pure hypercholesterolemia, unspecified: Secondary | ICD-10-CM | POA: Diagnosis not present

## 2023-03-01 DIAGNOSIS — I10 Essential (primary) hypertension: Secondary | ICD-10-CM | POA: Diagnosis not present

## 2023-03-01 DIAGNOSIS — Z8616 Personal history of COVID-19: Secondary | ICD-10-CM | POA: Diagnosis not present

## 2023-03-09 DIAGNOSIS — L570 Actinic keratosis: Secondary | ICD-10-CM | POA: Diagnosis not present

## 2023-03-16 DIAGNOSIS — I10 Essential (primary) hypertension: Secondary | ICD-10-CM | POA: Diagnosis not present

## 2023-04-07 IMAGING — CT CT ABD-PELV W/ CM
2 of 5 series · 16 of 46 positions shown, 18 images · IV contrast (APPLIED)
Comparison: CT abdomen and pelvis 04/05/2016

CLINICAL DATA: Abdominal pain

EXAM:
CT ABDOMEN AND PELVIS WITH CONTRAST
TECHNIQUE: Multidetector CT imaging of the abdomen and pelvis was performed
using the standard protocol following bolus administration of
intravenous contrast.

[Series 2: abd pel w · axial · 0.83mm/px · z∈[+885,+1290]mm · 13 of 91 slices shown, 15 images]
[im 5/91  soft-tissue]
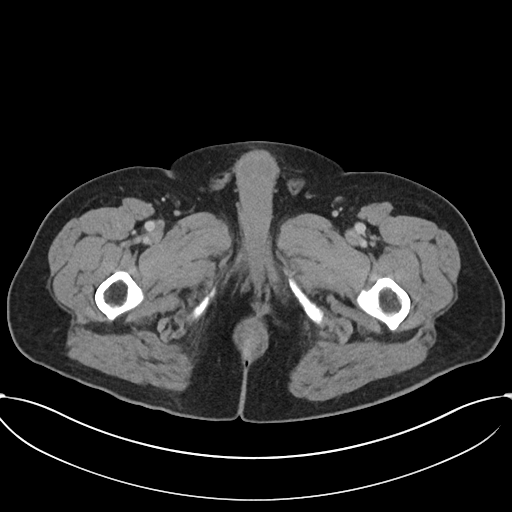
[im 5/91  bone]
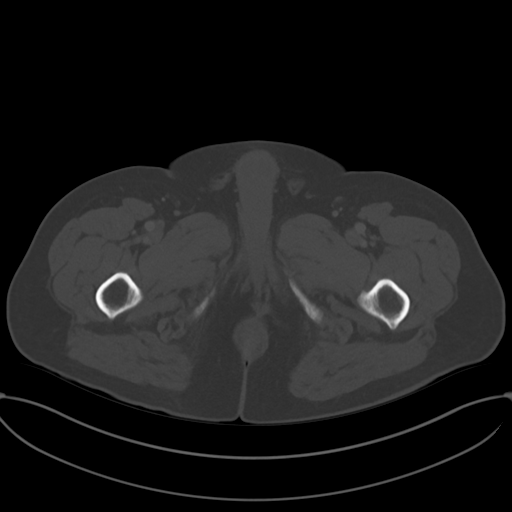
[im 15/91  soft-tissue]
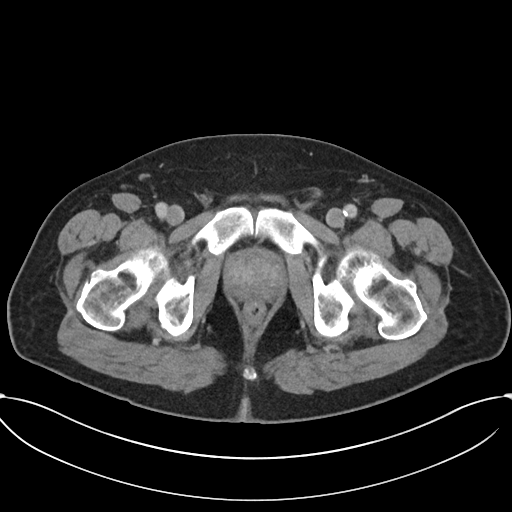
[im 19/91  soft-tissue]
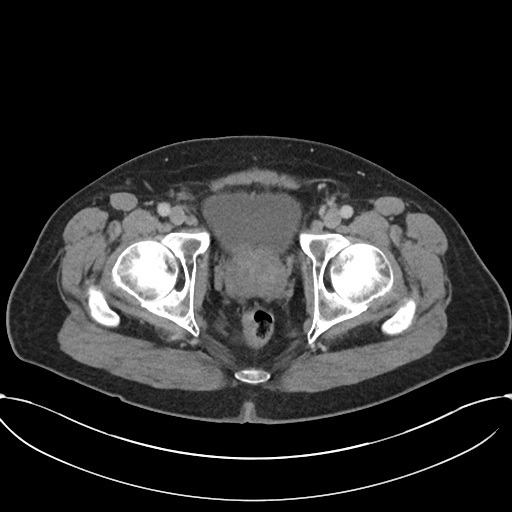
[im 24/91  soft-tissue]
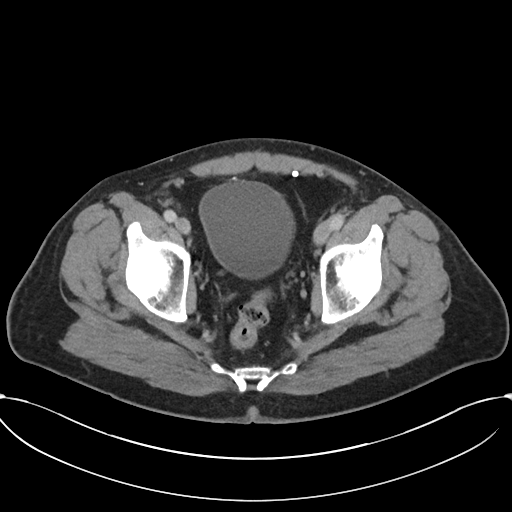
[im 34/91  soft-tissue]
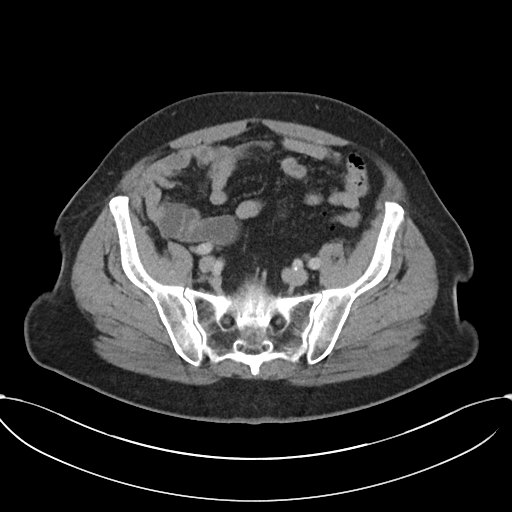
[im 38/91  soft-tissue]
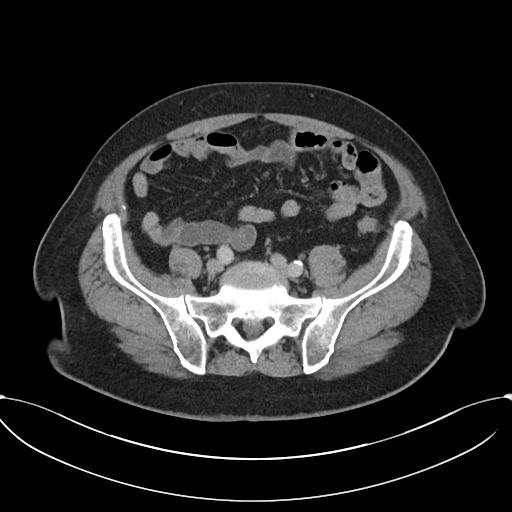
[im 48/91  soft-tissue]
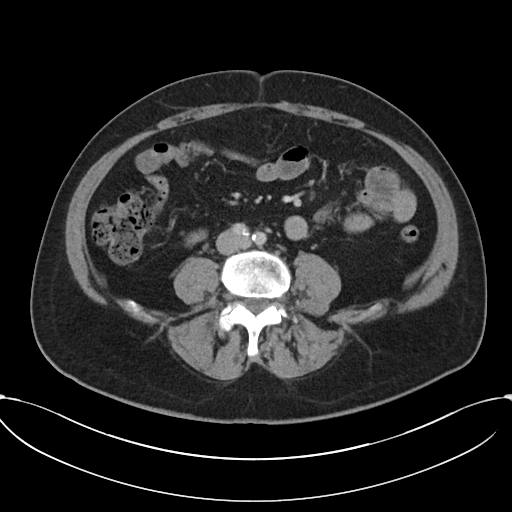
[im 53/91  soft-tissue]
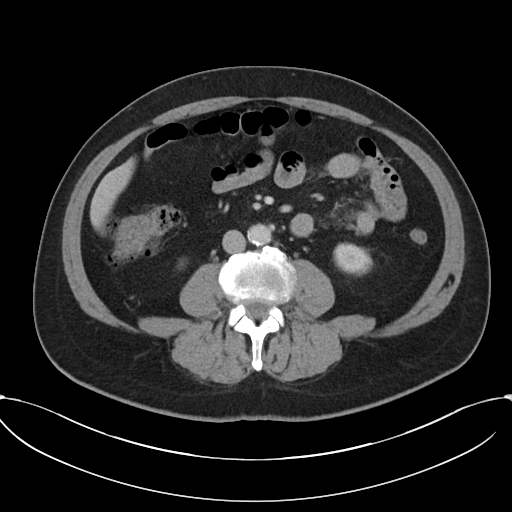
[im 57/91  soft-tissue]
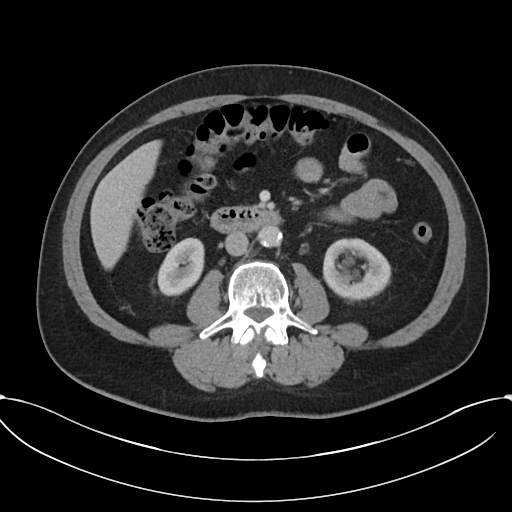
[im 57/91  bone]
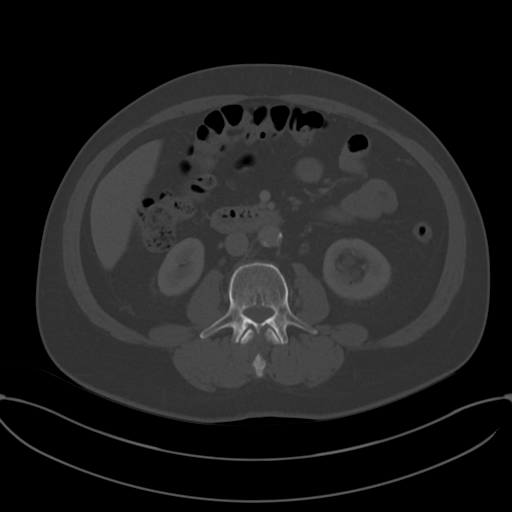
[im 67/91  soft-tissue]
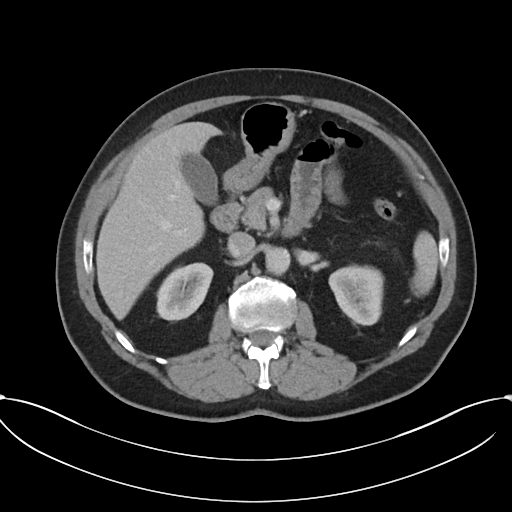
[im 72/91  soft-tissue]
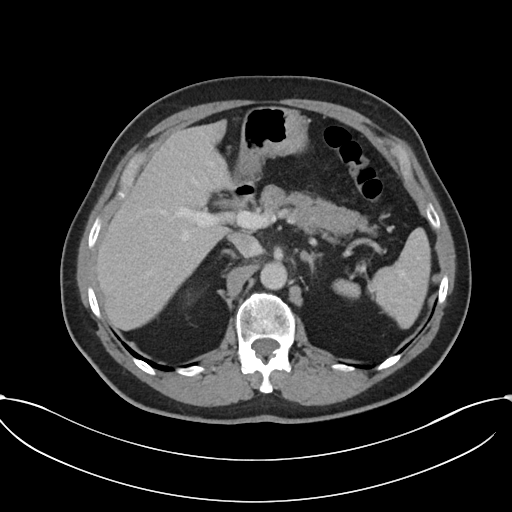
[im 76/91  soft-tissue]
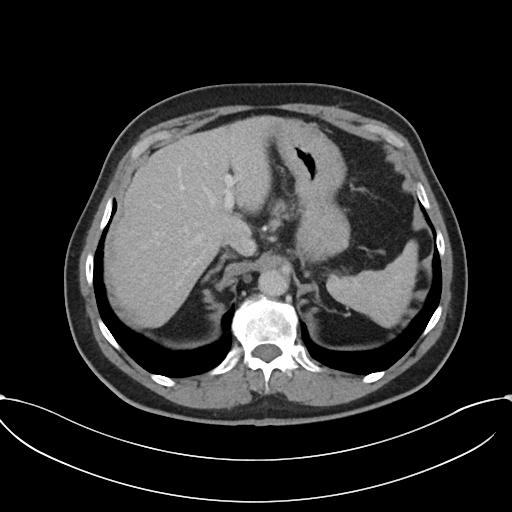
[im 86/91  soft-tissue]
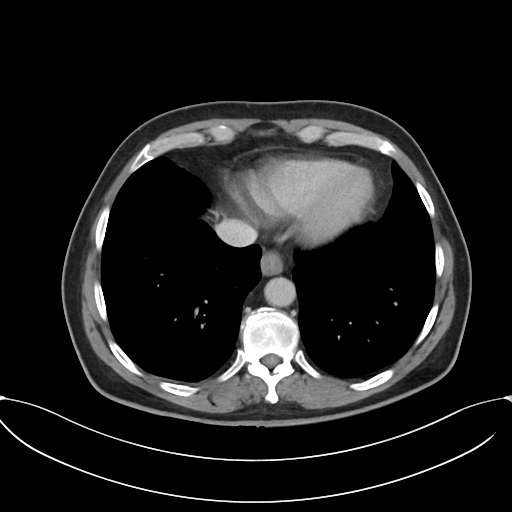

[Series 5: coronal · coronal · 0.82mm/px · 3 of 107 slices shown]
[im 36/107  soft-tissue]
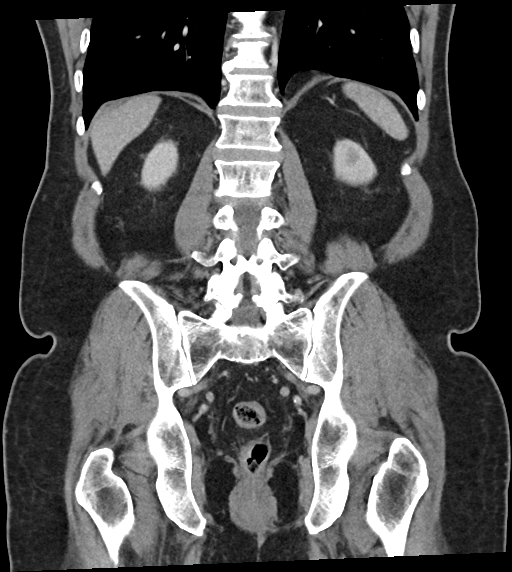
[im 48/107  soft-tissue]
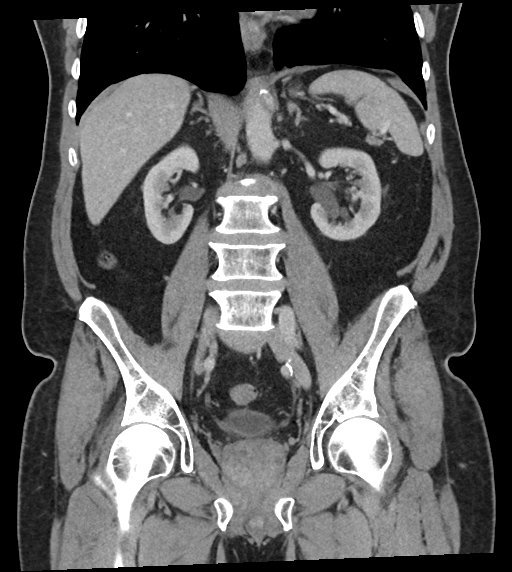
[im 59/107  soft-tissue]
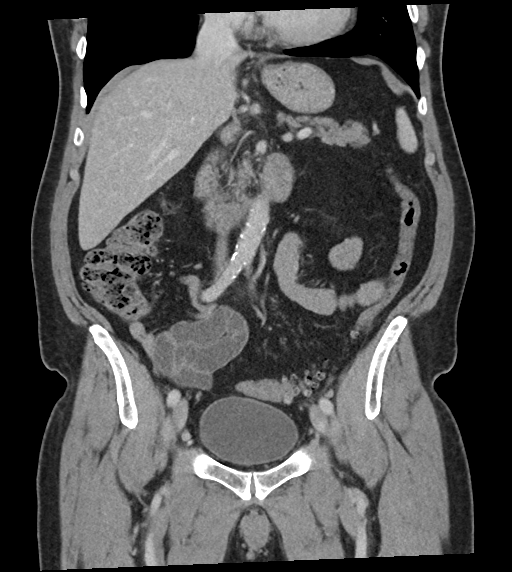

[16 of 46 positions shown; findings below may reference images not displayed]

RADIATION DOSE REDUCTION: This exam was performed according to the
departmental dose-optimization program which includes automated
exposure control, adjustment of the mA and/or kV according to
patient size and/or use of iterative reconstruction technique.

CONTRAST:  85mL OMNIPAQUE IOHEXOL 300 MG/ML  SOLN
FINDINGS: Lower chest: No acute abnormality.

Hepatobiliary: Liver is normal in size and contour. Bilobed cyst in
the right hepatic lobe measuring up to 2.5 cm. Gallbladder appears
normal. No biliary ductal dilatation.

Pancreas: Unremarkable. No pancreatic ductal dilatation or
surrounding inflammatory changes.

Spleen: Normal in size without focal abnormality.

Adrenals/Urinary Tract: Adrenal glands are unremarkable. Kidneys are
normal, without renal calculi, focal lesion, or hydronephrosis.
Bladder is unremarkable.

Stomach/Bowel: Small hiatal hernia. No bowel obstruction, free air
or pneumatosis. Colonic diverticulosis. No bowel wall edema.
Appendix is normal.

Vascular/Lymphatic: Aortic atherosclerosis. No enlarged abdominal or
pelvic lymph nodes.

Reproductive: Prostate gland is enlarged.

Other: No ascites.

Musculoskeletal: No acute or significant osseous findings.
IMPRESSION: 1. No acute process identified.
2. Small hiatal hernia.
3. Colonic diverticulosis.
4. Prostatomegaly.

## 2023-04-09 ENCOUNTER — Other Ambulatory Visit: Payer: Self-pay | Admitting: Cardiology

## 2023-04-09 DIAGNOSIS — I10 Essential (primary) hypertension: Secondary | ICD-10-CM

## 2023-04-27 ENCOUNTER — Other Ambulatory Visit: Payer: Self-pay

## 2023-04-27 DIAGNOSIS — L578 Other skin changes due to chronic exposure to nonionizing radiation: Secondary | ICD-10-CM | POA: Diagnosis not present

## 2023-04-27 DIAGNOSIS — D2261 Melanocytic nevi of right upper limb, including shoulder: Secondary | ICD-10-CM | POA: Diagnosis not present

## 2023-04-27 DIAGNOSIS — Z86018 Personal history of other benign neoplasm: Secondary | ICD-10-CM | POA: Diagnosis not present

## 2023-04-27 DIAGNOSIS — D485 Neoplasm of uncertain behavior of skin: Secondary | ICD-10-CM | POA: Diagnosis not present

## 2023-04-27 DIAGNOSIS — Z85828 Personal history of other malignant neoplasm of skin: Secondary | ICD-10-CM | POA: Diagnosis not present

## 2023-04-27 DIAGNOSIS — D1724 Benign lipomatous neoplasm of skin and subcutaneous tissue of left leg: Secondary | ICD-10-CM | POA: Diagnosis not present

## 2023-04-27 DIAGNOSIS — I10 Essential (primary) hypertension: Secondary | ICD-10-CM

## 2023-04-27 DIAGNOSIS — D2262 Melanocytic nevi of left upper limb, including shoulder: Secondary | ICD-10-CM | POA: Diagnosis not present

## 2023-04-27 DIAGNOSIS — D225 Melanocytic nevi of trunk: Secondary | ICD-10-CM | POA: Diagnosis not present

## 2023-04-27 DIAGNOSIS — L821 Other seborrheic keratosis: Secondary | ICD-10-CM | POA: Diagnosis not present

## 2023-04-27 DIAGNOSIS — L570 Actinic keratosis: Secondary | ICD-10-CM | POA: Diagnosis not present

## 2023-04-27 DIAGNOSIS — D2272 Melanocytic nevi of left lower limb, including hip: Secondary | ICD-10-CM | POA: Diagnosis not present

## 2023-04-27 MED ORDER — VALSARTAN-HYDROCHLOROTHIAZIDE 320-12.5 MG PO TABS: ORAL_TABLET | ORAL | 3 refills | Status: DC

## 2023-05-03 DIAGNOSIS — E7800 Pure hypercholesterolemia, unspecified: Secondary | ICD-10-CM | POA: Diagnosis not present

## 2023-05-03 DIAGNOSIS — R7301 Impaired fasting glucose: Secondary | ICD-10-CM | POA: Diagnosis not present

## 2023-05-03 DIAGNOSIS — N400 Enlarged prostate without lower urinary tract symptoms: Secondary | ICD-10-CM | POA: Diagnosis not present

## 2023-05-03 DIAGNOSIS — I10 Essential (primary) hypertension: Secondary | ICD-10-CM | POA: Diagnosis not present

## 2023-05-10 DIAGNOSIS — D649 Anemia, unspecified: Secondary | ICD-10-CM | POA: Diagnosis not present

## 2023-05-10 DIAGNOSIS — I10 Essential (primary) hypertension: Secondary | ICD-10-CM | POA: Diagnosis not present

## 2023-05-10 DIAGNOSIS — K649 Unspecified hemorrhoids: Secondary | ICD-10-CM | POA: Diagnosis not present

## 2023-05-10 DIAGNOSIS — E7800 Pure hypercholesterolemia, unspecified: Secondary | ICD-10-CM | POA: Diagnosis not present

## 2023-05-17 ENCOUNTER — Other Ambulatory Visit: Payer: Self-pay | Admitting: Cardiology

## 2023-05-17 DIAGNOSIS — I10 Essential (primary) hypertension: Secondary | ICD-10-CM

## 2023-06-17 DIAGNOSIS — L988 Other specified disorders of the skin and subcutaneous tissue: Secondary | ICD-10-CM | POA: Diagnosis not present

## 2023-06-17 DIAGNOSIS — D485 Neoplasm of uncertain behavior of skin: Secondary | ICD-10-CM | POA: Diagnosis not present

## 2023-07-04 ENCOUNTER — Telehealth: Payer: Self-pay | Admitting: Cardiology

## 2023-07-04 NOTE — Telephone Encounter (Signed)
Received a message via patient schedule of the patient wanting to know if labs are needed prior to one year follow up that is in the process of reschedule due to merge.   There is no active orders for labs at this time.   Please advise.

## 2023-07-05 ENCOUNTER — Telehealth: Payer: Self-pay | Admitting: Cardiology

## 2023-07-05 NOTE — Telephone Encounter (Signed)
Spoke with patient and he is getting labs drawn by PCP. He will have information sent to [place] for Dr. Jacinto Halim to review

## 2023-07-05 NOTE — Telephone Encounter (Signed)
New Message:     Patient says if he needs lab work in January, is it too early to get it  at this time?

## 2023-07-06 NOTE — Telephone Encounter (Signed)
Yes to come fasting

## 2023-07-07 DIAGNOSIS — K649 Unspecified hemorrhoids: Secondary | ICD-10-CM | POA: Diagnosis not present

## 2023-07-07 DIAGNOSIS — I10 Essential (primary) hypertension: Secondary | ICD-10-CM | POA: Diagnosis not present

## 2023-07-07 DIAGNOSIS — E7800 Pure hypercholesterolemia, unspecified: Secondary | ICD-10-CM | POA: Diagnosis not present

## 2023-07-07 DIAGNOSIS — D649 Anemia, unspecified: Secondary | ICD-10-CM | POA: Diagnosis not present

## 2023-07-07 LAB — LAB REPORT - SCANNED
A1c: 5.3
EGFR: 59

## 2023-07-14 DIAGNOSIS — D649 Anemia, unspecified: Secondary | ICD-10-CM | POA: Diagnosis not present

## 2023-07-14 DIAGNOSIS — E7800 Pure hypercholesterolemia, unspecified: Secondary | ICD-10-CM | POA: Diagnosis not present

## 2023-07-14 DIAGNOSIS — K649 Unspecified hemorrhoids: Secondary | ICD-10-CM | POA: Diagnosis not present

## 2023-07-14 DIAGNOSIS — I10 Essential (primary) hypertension: Secondary | ICD-10-CM | POA: Diagnosis not present

## 2023-07-16 NOTE — Progress Notes (Signed)
Labs 07/07/2023:  Hb 13.2/HCT 40.0, platelets 267, normal indices.  Serum glucose 92 mg, BUN 24, creatinine 1.29, EGFR 59 mL, sodium 135, potassium 4.1, LFTs normal.  Total cholesterol 190, triglycerides 96, HDL 44, LDL: 28.  A1c 5.3%.

## 2023-07-20 ENCOUNTER — Telehealth: Payer: Self-pay | Admitting: Gastroenterology

## 2023-07-20 NOTE — Telephone Encounter (Signed)
Patient c/o 2 days rectal bleeding. He states that he had a larger amount of brb yesterday with bowel movements and scant amount brb this morning with bowel movement. He denies any rectal pain. He denies any change in bowel habits, states that his stools are pretty soft. He was previously using metamucil but has discontinued this and still feels his bowel movements are good. States he is "just tired of dealing with the bleeding" and wanted to know what his options are. He has not tried any OTC medications for the bleeding this time around. Last office visit 10/2022 mentions the possibility of hemorrhoidal banding in the furture should symptoms persist.  In addition, patient mentions that he has had midsternal chest pain recently. Pain started 2 nights ago and kept him from going to sleep. He describes this as "a muscle pain" feeling; rates pain as a 4/10. Pain has been intermittent and occurred yesterday once as well for a short period of time. Patient states that both times this occurred, he was not not doing any activity. There was no associated diaphoresis or nausea/vomiting, SOB. I have advised that he contact cardiology (Dr Jacinto Halim) to let them know about this newer onset of intermittent chest pain. He states that he went to the emergency room less than 1 year ago with similar symptoms and was told that it was his "hiatal hernia".

## 2023-07-20 NOTE — Telephone Encounter (Signed)
Patient called requesting to speak with a nurse stated he has a lot of bleeding going on from hemorrhoids. Seeking further advise as soon as possible.

## 2023-07-20 NOTE — Telephone Encounter (Signed)
Thanks for taking his call Dottie.  Yes he does have a history of hemorrhoids and anal fissures, I am not sure what is causing this.  If it is due to hemorrhoids he may benefit from a banding.  He needs to be seen and examined to determine what is causing this.  I think I have an opening in my clinic schedule for Friday if you can book him in there and we can consider hemorrhoid banding if he is a candidate for that.  Otherwise agree he should talk with his cardiologist or primary care about his chest pain episode.  Thanks

## 2023-07-20 NOTE — Telephone Encounter (Signed)
I have spoken to patient to advise of Dr Venida Jarvis' response/recommendations. Patient has scheduled an appointment with Dr Adela Lank for 07/22/23 at 340 pm for further evaluation. In addition, I have reiterated to patient that he needs to contact his cardiologist in regards to new onset chest pain over the last several days. He verbalizes understanding of this information.

## 2023-07-21 NOTE — Progress Notes (Signed)
HPI :  72 year old male here for a follow-up visit for rectal bleeding.  I previously saw him in January for similar issue.  Recall he has a history of anal fissure and internal hemorrhoids.  His last colonoscopy was in 2018 for rectal bleeding which did not show any polyps, he had anal fissures and internal hemorrhoids on that exam as well.  He was treated with fiber supplementation and topical ointments which helped his symptoms at the time.  He is had periodic rectal bleeding since then.  He reports about 4 days ago he had 1 episode of a fair amount of blood in his stool that took some time to resolve.  He said he felt it was very superficial.  The next day he passed some faint pink in his stool but felt most of the bleeding was superficial perhaps due to wiping.  It was painless.  Denies any rectal pain with his bowel movements.  He has not had any further bleeding since then.  He is really not had any bleeding in his stools routinely since earlier in the year.  He moves his bowels regularly, denies any straining or constipation recently.  He has not been using any topical ointments or hemorrhoidal therapies otherwise.  He had lab work in September showing a hemoglobin of 13.2.  He otherwise inquires about his reflux regimen.  He has been using more Tums lately, perhaps every day for substernal discomfort, Tums can help relieve it.  He is currently taking omeprazole 20 mg every Monday Wednesday and Friday.  We had tried to reduce his dose to minimize risks of the regimen.  Recall he had a history of hiatal hernia and mild esophagitis in the past.  Prior evaluation:  EGD 08/29/2017 - 3cm HH, mild esophagitis, 4mm inflammatory nodule - biopsies benign Colonoscopy 08/29/2017 - anal fissures, posterior anal canal, moderate internal hemorrhoids, diverticulosis   Colonoscopy 01/25/2012 - normal exam, biopsies ruled out microscopic colitis, told to repeat in 10 years EGD 01/25/2012 - mild  esophagitis, no evidence of Barrett's   CT scan 04/05/2016 - IMPRESSION: 1. Mild sigmoid diverticulosis. Negative for diverticulitis. No bowel mass or obstruction 2. Prostate enlargement 3. Atherosclerotic disease 4. Liver cyst slightly larger but appears benign     CT scan abdomen  pelvis 02/22/22: IMPRESSION: 1. No acute process identified. 2. Small hiatal hernia. 3. Colonic diverticulosis. 4. Prostatomegaly.      Past Medical History:  Diagnosis Date   Allergy    Anal fissure    Cancer (HCC)    basal cell CA- forehead   DDD (degenerative disc disease), cervical    Hemorrhoids    Hernia, inguinal    Hiatal hernia    Hiatal hernia    Hypertension    IBS (irritable bowel syndrome)    Prostate atrophy 2023   Ulcer      Past Surgical History:  Procedure Laterality Date   FINGER SURGERY     Left Pinky finger   INGUINAL HERNIA REPAIR     bilateral   MOHS SURGERY     top of his head   NASAL SEPTUM SURGERY     right knee surgery     TONSILLECTOMY AND ADENOIDECTOMY     TONSILLECTOMY AND ADENOIDECTOMY     Family History  Problem Relation Age of Onset   Hypertension Mother    Heart failure Mother 33       Passed at 49   Heart disease Father    Alzheimer's disease  Father 4   Heart disease Brother    Colon cancer Neg Hx    Esophageal cancer Neg Hx    Rectal cancer Neg Hx    Stomach cancer Neg Hx    Social History   Tobacco Use   Smoking status: Former    Current packs/day: 0.00    Average packs/day: 1.5 packs/day for 15.0 years (22.5 ttl pk-yrs)    Types: Cigarettes    Start date: 10/11/1965    Quit date: 10/11/1980    Years since quitting: 42.8   Smokeless tobacco: Never  Vaping Use   Vaping status: Never Used  Substance Use Topics   Alcohol use: Yes    Comment: 2-3 drinks per MONTH   Drug use: No   Current Outpatient Medications  Medication Sig Dispense Refill   hydrALAZINE (APRESOLINE) 50 MG tablet TAKE 1 TABLET BY MOUTH THREE TIMES A DAY 270  tablet 1   omeprazole (PRILOSEC OTC) 20 MG tablet Take 20 mg by mouth daily. mwf     valsartan-hydrochlorothiazide (DIOVAN-HCT) 320-12.5 MG tablet TAKE 1 TABLET BY MOUTH EVERY DAY IN THE MORNING 90 tablet 3   AMBULATORY NON FORMULARY MEDICATION Diltiazem gel with 2% lidocaine  Apply a pea sized amount into your rectum three times daily for weeks or until fissure is healed Dispense 30 GM zero refill 30 g 0   Rectal Protectant-Emollient (CALMOL-4) 76-10 % SUPP Use as directed once to twice daily 6 suppository 0   rosuvastatin (CRESTOR) 10 MG tablet Take 10 mg by mouth once a week.     tamsulosin (FLOMAX) 0.4 MG CAPS capsule Take 0.4 mg by mouth daily.     No current facility-administered medications for this visit.   No Known Allergies   Review of Systems: All systems reviewed and negative except where noted in HPI.    No results found.  Physical Exam: BP 132/70   Pulse 67   Ht 5\' 7"  (1.702 m)   Wt 188 lb (85.3 kg)   BMI 29.44 kg/m  Constitutional: Pleasant,well-developed, male in no acute distress. Perianal exam - CMA Lucius Conn standby - posterior midline anal fissure , hemorrhoids Psychiatric: Normal mood and affect. Behavior is normal.   ASSESSMENT: 72 y.o. male here for assessment of the following  1. Rectal bleeding   2. Anal fissure   3. Hemorrhoids, unspecified hemorrhoid type   4. Gastroesophageal reflux disease, unspecified whether esophagitis present   5. Long-term current use of proton pump inhibitor therapy    Based on DRE today with a clear posterior midline anal fissure, I think this is the cause of his symptoms.  He does not have much discomfort with this.  Do not recommend banding of his hemorrhoids as the fissure is the more likely cause and this can make the fissure worse.  We discussed this.  Recommend he take a daily fiber supplement, he previously was on Metamucil and should resume that.  Recommend diltiazem with lidocaine ointment, pea-sized amount  applied PR 3 times daily for several weeks or until healed.  Hopefully with this intervention his symptoms resolve.  We discussed doing colonoscopy sooner than normally scheduled if he wanted to have another exam for peace of mind.  He wants to monitor for now.  Holding off on hemorrhoid banding pending his course.  We discussed his reflux, it appears omeprazole 3 days weekly is not controlling his symptoms too well.  EGD shows no prior Barrett's.  Recommend taking omeprazole 20 mg daily for 4-week trial  and see if that helps him.  Over time he can titrate down as he tolerates.  If no improvement despite daily dose then he should contact me for further advice.  He agrees.   PLAN: - refill diltiazem with lidocaine ointment - use pea sized amount PR TID until healed - fiber supplement daily - he has metamucil - no banding for now, may consider it pending his course - take omeprazole 20mg  / day for 4 weeks trial. Long term use lowest dose needed to control symptoms, we discussed potential side effects / risks. If no improvement contact me for further advice. EGD is UTD  Harlin Rain, MD [facility]

## 2023-07-22 ENCOUNTER — Ambulatory Visit: Payer: Medicare Other | Admitting: Gastroenterology

## 2023-07-22 ENCOUNTER — Encounter: Payer: Self-pay | Admitting: Gastroenterology

## 2023-07-22 VITALS — BP 132/70 | HR 67 | Ht 67.0 in | Wt 188.0 lb

## 2023-07-22 DIAGNOSIS — K602 Anal fissure, unspecified: Secondary | ICD-10-CM | POA: Diagnosis not present

## 2023-07-22 DIAGNOSIS — K649 Unspecified hemorrhoids: Secondary | ICD-10-CM

## 2023-07-22 DIAGNOSIS — Z79899 Other long term (current) drug therapy: Secondary | ICD-10-CM | POA: Diagnosis not present

## 2023-07-22 DIAGNOSIS — K219 Gastro-esophageal reflux disease without esophagitis: Secondary | ICD-10-CM

## 2023-07-22 DIAGNOSIS — K625 Hemorrhage of anus and rectum: Secondary | ICD-10-CM

## 2023-07-22 MED ORDER — AMBULATORY NON FORMULARY MEDICATION: 1 refills | Status: DC

## 2023-07-22 NOTE — Patient Instructions (Addendum)
We have sent a prescription for Diltiazem ointment with Lidocaine to [facility]. You should apply a pea size amount to your rectum three times daily until healed.  [facility]'s information is below: Address: [address] [place], [place] [address]  [phone]  *Please DO NOT go directly from our office to pick up this medication! Give the pharmacy 1 day to process the prescription as this is compounded and takes time to make.   Take a daily fiber supplement.  Take Omeprazole 20 mg daily for 4 weeks.  If no improvement, please contact us.  Thank you for entrusting me with your care and for choosing [facility], Dr. Ileene Patrick    If your blood pressure at your visit was 140/90 or greater, please contact your primary care physician to follow up on this. ______________________________________________________  If you are age 93 or older, your body mass index should be between 23-30. Your Body mass index is 29.44 kg/m. If this is out of the aforementioned range listed, please consider follow up with your Primary Care Provider.  If you are age 63 or younger, your body mass index should be between 19-25. Your Body mass index is 29.44 kg/m. If this is out of the aformentioned range listed, please consider follow up with your Primary Care Provider.  ________________________________________________________  The [facility] providers would like to encourage you to use [facility] to communicate with providers for non-urgent requests or questions.  Due to long hold times on the telephone, sending your provider a message by [facility] may be a faster and more efficient way to get a response.  Please allow 48 business hours for a response.  Please remember that this is for non-urgent requests.  _______________________________________________________  Due to recent changes in healthcare laws, you may see the results of your imaging and laboratory studies on MyChart  before your provider has had a chance to review them.  We understand that in some cases there may be results that are confusing or concerning to you. Not all laboratory results come back in the same time frame and the provider may be waiting for multiple results in order to interpret others.  Please give [place] 48 hours in order for your provider to thoroughly review all the results before contacting the office for clarification of your results.

## 2023-08-09 ENCOUNTER — Telehealth: Payer: Self-pay | Admitting: Gastroenterology

## 2023-08-09 NOTE — Telephone Encounter (Signed)
Inbound call from patient stating that he is still having issues with rectal bleeding. Wanting to discuss his next steps moving forward. Please advise.

## 2023-08-10 NOTE — Telephone Encounter (Signed)
Great, thanks Jan 

## 2023-08-10 NOTE — Telephone Encounter (Signed)
Called patient.  He reports that he is continuing to see blood off and on.  It is usually on the tissue but occasionally will drip into toilet.  He has a little rectal discomfort but called it minimal. He indicates his stool is not hard and he is not straining. He is using the diltiazem ointment BID but has stopped the fiber supplement.  He agrees to continue diltiazem 2 to 3 times a day for a couple more weeks and resume the fiber supplement.   He will keep [place] updated and knows to call right away if he sees dark blood or the bleeding worsens.

## 2023-08-10 NOTE — Telephone Encounter (Signed)
Thanks Jan. I suspect due to the fissure +/- hemorrhoids that I recently saw him for - please tell him the fissure can take weeks to heal. However, if this persists despite management, he needs to let us know and can do a colonoscopy. If he is anxious about this or wants to book the colonoscopy that is okay with me. Can you let me know what he wants to do? Thanks

## 2023-09-11 ENCOUNTER — Encounter (INDEPENDENT_AMBULATORY_CARE_PROVIDER_SITE_OTHER): Payer: Medicare Other | Admitting: Gastroenterology

## 2023-09-11 DIAGNOSIS — K625 Hemorrhage of anus and rectum: Secondary | ICD-10-CM

## 2023-09-11 DIAGNOSIS — K602 Anal fissure, unspecified: Secondary | ICD-10-CM | POA: Diagnosis not present

## 2023-09-12 MED ORDER — AMBULATORY NON FORMULARY MEDICATION: 0 refills | Status: DC

## 2023-09-12 NOTE — Addendum Note (Signed)
Addended by: Richardson Chiquito on: 09/12/2023 04:43 PM   Modules accepted: Orders

## 2023-09-12 NOTE — Telephone Encounter (Signed)
I sent this message to the patient  I hope you are not taking your blood pressure medications as your blood pressure is very soft.  You can get that medications and have if you are still taking the blood pressure medicines.  I would recommend that you proceed with colonoscopy, I do not think that it will cause any more discomfort than what you are going through.  Dr. Adela Lank has forwarded the message to me as well regarding proceeding with sigmoidoscopy to evaluate further if not colonoscopy.  I would leave it to his expertise to see which 1 will be least painful  I do believe that if your symptoms are not improving, surgical option should be considered, although painful at least you will have full recovery and once recovered we will do well.  I will certainly forward this message to Dr. Adela Lank as well.

## 2023-09-12 NOTE — Telephone Encounter (Signed)
Please see the MyChart message reply(ies) for my assessment and plan.    This patient gave consent for this Medical Advice Message and is aware that it may result in a bill to [employer], as well as the possibility of receiving a bill for a co-payment or deductible. They are an established patient, but are not seeking medical advice exclusively about a problem treated during an in person or video visit in the last seven days. I did not recommend an in person or video visit within seven days of my reply.    I spent a total of 10 minutes cumulative time within 7 days through [employer].  Benancio Deeds, MD

## 2023-09-12 NOTE — Telephone Encounter (Signed)
Rx for nitroglycerin 0.125% gel has been sent to [facility].

## 2023-09-20 DIAGNOSIS — H903 Sensorineural hearing loss, bilateral: Secondary | ICD-10-CM | POA: Diagnosis not present

## 2023-10-14 ENCOUNTER — Ambulatory Visit: Payer: Self-pay | Admitting: Cardiology

## 2023-10-23 NOTE — Progress Notes (Signed)
 Cardiology Office Note:  .   Date:  10/24/2023  ID:  John Bird, DOB January 21, 1951, MRN [id number] PCP: Royden Ronal Czar, FNP  [facility] Providers Cardiologist:  Gordy Bergamo, MD   History of Present Illness: .   John Bird is a 73 y.o. Caucasian  male  with chronic asymptomatic sinus bradycardia, hyperlipidemia,  prior 10-15-pack-year  cigarette use, quit remotely, mild aortic ectasia, abdominal aortic atherosclerosis by CT scan, family history of premature coronary artery disease in his brother having MI at age 24Y, chronic stable angina and DOE with nuclear stress test on 09/17/2018 revealing mild inferior ischemia.   Discussed the use of AI scribe software for clinical note transcription with the patient, who gave verbal consent to proceed.  History of Present Illness   The patient, with a history of hypertension, presents for a routine follow-up. He has been closely monitoring his blood pressure and adjusting his medication (Hydralazine ) dosage accordingly. The patient reports that his blood pressure has been mostly well controlled, but he has noticed occasional episodes of low blood pressure, which he describes as feeling dizzy. He has been taking two doses of Hydralazine  daily instead of three due to these episodes. The patient also mentions a history of gastrointestinal issues, but these are not discussed in detail during this visit. The patient's son, who is not the doctor's patient, is also discussed. The son is suffering from mental health issues, and the patient is seeking advice on how to help him.      Labs   Lab Results  Component Value Date   NA 138 01/25/2023   K 4.8 01/25/2023   CO2 22 01/25/2023   GLUCOSE 94 01/25/2023   BUN 22 01/25/2023   CREATININE 1.39 (H) 01/25/2023   CALCIUM 8.9 01/25/2023   GFR 58.41 (L) 08/07/2019   EGFR 59.0 07/07/2023   GFRNONAA >60 01/11/2023      Latest Ref Rng & Units 01/25/2023   10:51 AM 01/11/2023   12:04 AM 02/22/2022    11:37 AM  BMP  Glucose 70 - 99 mg/dL 94  880  97   BUN 8 - 27 mg/dL 22  25  21    Creatinine 0.76 - 1.27 mg/dL 8.60  8.81  8.72   BUN/Creat Ratio 10 - 24 16     Sodium 134 - 144 mmol/L 138  137  139   Potassium 3.5 - 5.2 mmol/L 4.8  4.3  4.2   Chloride 96 - 106 mmol/L 103  107  107   CO2 20 - 29 mmol/L 22  21  24    Calcium 8.6 - 10.2 mg/dL 8.9  8.9  9.2       Latest Ref Rng & Units 01/11/2023   12:04 AM 02/22/2022   11:37 AM 09/30/2020   12:50 PM  CBC  WBC 4.0 - 10.5 K/uL 7.8  6.3  4.1   Hemoglobin 13.0 - 17.0 g/dL 86.6  86.9  86.4   Hematocrit 39.0 - 52.0 % 40.1  39.5  40.4   Platelets 150 - 400 K/uL 195  248  197    External Labs:  Labs 07/07/2023:   Hb 13.2/HCT 40.0, platelets 267, normal indices.   Serum glucose 92 mg, BUN 24, creatinine 1.29, EGFR 59 mL, sodium 135, potassium 4.1, LFTs normal.   Total cholesterol 190, triglycerides 96, HDL 44, LDL: 28.   A1c 5.3%.  Review of Systems  Cardiovascular:  Negative for chest pain, dyspnea on exertion and leg swelling.  Physical Exam:   VS:  BP 122/70 (BP Location: Left Arm, Patient Position: Sitting, Cuff Size: Normal)   Pulse (!) 59   Resp 16   Ht 5' 7 (1.702 m)   Wt 86.2 kg   SpO2 98%   BMI 29.76 kg/m    Wt Readings from Last 3 Encounters:  10/24/23 86.2 kg  07/22/23 85.3 kg  01/14/23 88.1 kg     Physical Exam Neck:     Vascular: No carotid bruit or JVD.  Cardiovascular:     Rate and Rhythm: Normal rate and regular rhythm.     Pulses: Intact distal pulses.     Heart sounds: Normal heart sounds. No murmur heard.    No gallop.  Pulmonary:     Effort: Pulmonary effort is normal.     Breath sounds: Normal breath sounds.  Abdominal:     General: Bowel sounds are normal.     Palpations: Abdomen is soft.  Musculoskeletal:     Right lower leg: No edema.     Left lower leg: No edema.    Studies Reviewed: .    Echocardiogram 08/03/2021: Left ventricle cavity is normal in size and wall thickness.  Normal global wall motion. Normal LV systolic function with EF 69%. Doppler evidence of grade I (impaired) diastolic dysfunction, normal LAP. Left atrial cavity is mildly dilated. Aneurysmal interatrial septum without 2D or color Doppler evidence of shunting. Structurally normal trileaflet aortic valve. No evidence of aortic stenosis. Mild (Grade I) aortic regurgitation. Estimated right atrial pressure 8 mmHg. Previous study in 2019 reported grade 2 DD, mild MR-not appreciated on this study.  EKG:    EKG Interpretation Date/Time:  Monday October 24 2023 10:15:13 EST Ventricular Rate:  58 PR Interval:  168 QRS Duration:  92 QT Interval:  410 QTC Calculation: 402 R Axis:   85  Text Interpretation: EKG 10/24/2023: Normal sinus rhythm at rate of 58 bpm, rightward axis, no evidence of ischemia.  Compared to 01/10/2023, rightward axis new but otherwise no significant change. Confirmed by Arcenio Mullaly, Jagadeesh [phone]) on 10/24/2023 10:33:37 AM    EKG 01/10/2023: Normal sinus rhythm at rate of 54 bpm. Normal EKG.   Medications and allergies    No Known Allergies   Current Outpatient Medications:    omeprazole  (PRILOSEC OTC) 20 MG tablet, Take 20 mg by mouth daily. mwf, Disp: , Rfl:    valsartan -hydrochlorothiazide  (DIOVAN -HCT) 320-12.5 MG tablet, TAKE 1 TABLET BY MOUTH EVERY DAY IN THE MORNING, Disp: 90 tablet, Rfl: 3   hydrALAZINE  (APRESOLINE ) 50 MG tablet, TAKE 1 TABLET BY MOUTH THREE TIMES A DAY (Patient not taking: Reported on 10/24/2023), Disp: 270 tablet, Rfl: 1   ASSESSMENT AND PLAN: .      ICD-10-CM   1. Bradycardia by electrocardiogram  R00.1 EKG 12-Lead    2. Essential (primary) hypertension  I10     3. Stage 3a chronic kidney disease (HCC)  N18.31     4. Pure hypercholesterolemia  E78.00      1. Bradycardia by electrocardiogram Patient is asymptomatic bradycardia but remains very active without dyspnea or chest pain, no changes in the medications were done today.  2. Essential  (primary) hypertension Patient brings home blood pressure recordings under excellent control, presently on valsartan  and hydralazine  with a combination he has done very well.  He monitors his blood pressure closely.  3. Stage 3a chronic kidney disease (HCC) I reviewed his external labs, stage IIIa chronic kidney disease has remained stable.  Continue valsartan .  I discussed with him regarding the beneficial effects of valsartan  on blood pressure and also vascular function and renal protection.  4. Pure hypercholesterolemia Patient previously was on Crestor 10 mg 1-2 times a week, his LDL was very low, he does have mild carotid atherosclerosis, would benefit from being on a low-dose of Crestor, he has complete physical coming up soon and if LDL is >70, could consider adding Crestor 5 mg twice a week.  Otherwise he remained stable from cardiac standpoint, I will see him back on a as needed basis.  Assessment and Plan    Hypertension Well controlled with Valsartan  and Hydralazine . Patient self-adjusted Hydralazine  dose due to occasional low readings and associated dizziness. Discussed the importance of consistent dosing and the benefits of Valsartan  beyond blood pressure control. -Continue Valsartan  and Hydralazine  (2 doses daily as tolerated). -Monitor blood pressure intermittently, varying times of day.  Hyperlipidemia Patient self-discontinued Rosuvastatin due to perceived side effects. Discussed the importance of statin therapy in reducing risk of stroke and heart attack, especially given the presence of plaque in the neck artery. -Check cholesterol levels at upcoming physical with Ronal Cos. -If cholesterol >70-75, consider restarting Rosuvastatin at a lower dose (5mg  twice weekly).  Mental Health Concerns (Patient's son) Patient's son experiencing significant mental health issues, including anger management problems and possible major depression. Patient seeking resources for  assistance. -Provide patient with resources for mental health services for his son.  Follow-up as needed.      Signed,  Gordy Bergamo, MD, [facility] 10/24/2023, 11:24 AM [facility] [address] [place], [place] [address] Phone: [phone]. Fax:  [phone]

## 2023-10-24 ENCOUNTER — Encounter: Payer: Self-pay | Admitting: Cardiology

## 2023-10-24 ENCOUNTER — Ambulatory Visit: Payer: Medicare Other | Attending: Cardiology | Admitting: Cardiology

## 2023-10-24 VITALS — BP 122/70 | HR 59 | Resp 16 | Ht 67.0 in | Wt 190.0 lb

## 2023-10-24 DIAGNOSIS — I10 Essential (primary) hypertension: Secondary | ICD-10-CM | POA: Insufficient documentation

## 2023-10-24 DIAGNOSIS — E7800 Pure hypercholesterolemia, unspecified: Secondary | ICD-10-CM | POA: Insufficient documentation

## 2023-10-24 DIAGNOSIS — N1831 Chronic kidney disease, stage 3a: Secondary | ICD-10-CM | POA: Diagnosis not present

## 2023-10-24 DIAGNOSIS — R001 Bradycardia, unspecified: Secondary | ICD-10-CM | POA: Diagnosis not present

## 2023-10-24 NOTE — Patient Instructions (Signed)

## 2023-11-08 ENCOUNTER — Other Ambulatory Visit: Payer: Self-pay | Admitting: Cardiology

## 2023-11-08 DIAGNOSIS — I10 Essential (primary) hypertension: Secondary | ICD-10-CM

## 2023-11-21 ENCOUNTER — Encounter: Payer: Self-pay | Admitting: Cardiology

## 2023-12-02 ENCOUNTER — Encounter: Payer: Self-pay | Admitting: Cardiology

## 2023-12-09 ENCOUNTER — Encounter (HOSPITAL_BASED_OUTPATIENT_CLINIC_OR_DEPARTMENT_OTHER): Payer: Self-pay | Admitting: Emergency Medicine

## 2023-12-09 ENCOUNTER — Other Ambulatory Visit: Payer: Self-pay

## 2023-12-09 ENCOUNTER — Emergency Department (HOSPITAL_BASED_OUTPATIENT_CLINIC_OR_DEPARTMENT_OTHER): Payer: Medicare Other | Admitting: Radiology

## 2023-12-09 ENCOUNTER — Emergency Department (HOSPITAL_BASED_OUTPATIENT_CLINIC_OR_DEPARTMENT_OTHER)
Admission: EM | Admit: 2023-12-09 | Discharge: 2023-12-09 | Disposition: A | Discharge status: Home / Self Care | Payer: Medicare Other | Attending: Emergency Medicine | Admitting: Emergency Medicine

## 2023-12-09 ENCOUNTER — Emergency Department (HOSPITAL_BASED_OUTPATIENT_CLINIC_OR_DEPARTMENT_OTHER): Payer: Medicare Other

## 2023-12-09 DIAGNOSIS — N189 Chronic kidney disease, unspecified: Secondary | ICD-10-CM | POA: Diagnosis not present

## 2023-12-09 DIAGNOSIS — R079 Chest pain, unspecified: Secondary | ICD-10-CM | POA: Diagnosis not present

## 2023-12-09 DIAGNOSIS — Z79899 Other long term (current) drug therapy: Secondary | ICD-10-CM | POA: Insufficient documentation

## 2023-12-09 DIAGNOSIS — D649 Anemia, unspecified: Secondary | ICD-10-CM | POA: Diagnosis not present

## 2023-12-09 DIAGNOSIS — I129 Hypertensive chronic kidney disease with stage 1 through stage 4 chronic kidney disease, or unspecified chronic kidney disease: Secondary | ICD-10-CM | POA: Insufficient documentation

## 2023-12-09 DIAGNOSIS — I3139 Other pericardial effusion (noninflammatory): Secondary | ICD-10-CM | POA: Diagnosis not present

## 2023-12-09 DIAGNOSIS — R0789 Other chest pain: Secondary | ICD-10-CM | POA: Diagnosis not present

## 2023-12-09 DIAGNOSIS — R918 Other nonspecific abnormal finding of lung field: Secondary | ICD-10-CM | POA: Diagnosis not present

## 2023-12-09 LAB — BASIC METABOLIC PANEL
Anion gap: 8 (ref 5–15)
BUN: 22 mg/dL (ref 8–23)
CO2: 23 mmol/L (ref 22–32)
Calcium: 8.6 mg/dL — ABNORMAL LOW (ref 8.9–10.3)
Chloride: 105 mmol/L (ref 98–111)
Creatinine, Ser: 1.31 mg/dL — ABNORMAL HIGH (ref 0.61–1.24)
GFR, Estimated: 58 mL/min — ABNORMAL LOW (ref >60)
Glucose, Bld: 112 mg/dL — ABNORMAL HIGH (ref 70–99)
Potassium: 3.9 mmol/L (ref 3.5–5.1)
Sodium: 136 mmol/L (ref 135–145)

## 2023-12-09 LAB — CBC
HCT: 36.4 % — ABNORMAL LOW (ref 39.0–52.0)
Hemoglobin: 12.2 g/dL — ABNORMAL LOW (ref 13.0–17.0)
MCH: 29.5 pg (ref 26.0–34.0)
MCHC: 33.5 g/dL (ref 30.0–36.0)
MCV: 88.1 fL (ref 80.0–100.0)
Platelets: 267 10*3/uL (ref 150–400)
RBC: 4.13 MIL/uL — ABNORMAL LOW (ref 4.22–5.81)
RDW: 13 % (ref 11.5–15.5)
WBC: 9.2 10*3/uL (ref 4.0–10.5)
nRBC: 0 % (ref 0.0–0.2)

## 2023-12-09 LAB — TROPONIN I (HIGH SENSITIVITY)
Troponin I (High Sensitivity): 4 ng/L (ref <18)
Troponin I (High Sensitivity): 4 ng/L (ref <18)

## 2023-12-09 MED ORDER — SODIUM CHLORIDE 0.9 % IV BOLUS
500.0000 mL | Freq: Once | INTRAVENOUS | Status: AC
  Administered 2023-12-09: 500 mL via INTRAVENOUS

## 2023-12-09 MED ORDER — FAMOTIDINE IN NACL 20-0.9 MG/50ML-% IV SOLN
20.0000 mg | Freq: Once | INTRAVENOUS | Status: AC
  Administered 2023-12-09: 20 mg via INTRAVENOUS
  Filled 2023-12-09: qty 50

## 2023-12-09 MED ORDER — IOHEXOL 350 MG/ML SOLN
75.0000 mL | Freq: Once | INTRAVENOUS | Status: AC | PRN
  Administered 2023-12-09: 75 mL via INTRAVENOUS

## 2023-12-09 NOTE — ED Notes (Signed)
 Patient states pain is worst with deep breath.

## 2023-12-09 NOTE — ED Provider Notes (Signed)
 [place] EMERGENCY DEPARTMENT AT [facility] Provider Note   CSN: [id number] Arrival date & time: 12/09/23  8413     History  Chief Complaint  Patient presents with   Chest Pain    John Bird is a 73 y.o. male.   Chest Pain Patient is a 73 year old male presents the ED today complaining of a 1 day history of chest pain that radiates to left arm and up the neck accompanied with a 1 week history of hand and foot pain with swelling, particularly worse at the dorsal aspects of foot and ankle, fingers and left wrist.  Reports that pain is worse when taking deep inspiration and is unlike his GERD symptoms.  States only medication changes had recently was that he recently stopped taking his statin in January, due to perceived side effects.  Symptoms not worse with exertion.  Denies fever, cough, congestion, shortness of breath, abdominal pain, dysuria, nausea, vomiting, diarrhea, calf swelling.  Home Medications Prior to Admission medications   Medication Sig Start Date End Date Taking? Authorizing Provider  hydrALAZINE (APRESOLINE) 50 MG tablet TAKE 1 TABLET BY MOUTH THREE TIMES A DAY 11/09/23   Yates Decamp, MD  omeprazole (PRILOSEC OTC) 20 MG tablet Take 20 mg by mouth daily. mwf    [provider]  valsartan-hydrochlorothiazide (DIOVAN-HCT) 320-12.5 MG tablet TAKE 1 TABLET BY MOUTH EVERY DAY IN THE MORNING 04/27/23   Yates Decamp, MD      Allergies    Patient has no known allergies.    Review of Systems   Review of Systems  Cardiovascular:  Positive for chest pain.  Musculoskeletal:  Positive for joint swelling.  All other systems reviewed and are negative.   Physical Exam Updated Vital Signs BP (!) 100/56   Pulse (!) 57   Temp 98 F (36.7 C) (Oral)   Resp 10   Ht 5\' 7"  (1.702 m)   Wt 83.9 kg   SpO2 99%   BMI 28.98 kg/m  Physical Exam Vitals and nursing note reviewed.  Constitutional:      General: He is not in acute distress.    Appearance:  Normal appearance. He is not ill-appearing.  HENT:     Head: Normocephalic and atraumatic.  Eyes:     Extraocular Movements: Extraocular movements intact.     Conjunctiva/sclera: Conjunctivae normal.  Cardiovascular:     Rate and Rhythm: Normal rate and regular rhythm.     Pulses: Normal pulses.     Heart sounds: Normal heart sounds. No murmur heard.    No friction rub. No gallop.  Pulmonary:     Effort: Pulmonary effort is normal. No tachypnea or respiratory distress.     Breath sounds: Normal breath sounds. No decreased breath sounds, wheezing, rhonchi or rales.  Chest:     Chest wall: No tenderness.  Abdominal:     General: Abdomen is flat. There is no abdominal bruit.     Palpations: Abdomen is soft.     Tenderness: There is no abdominal tenderness.  Musculoskeletal:     Right lower leg: No edema.     Left lower leg: No edema.  Skin:    General: Skin is warm and dry.     Coloration: Skin is not cyanotic or pale.     Findings: No ecchymosis or erythema.  Neurological:     General: No focal deficit present.     Mental Status: He is alert and oriented to person, place, and time. Mental status is  at baseline.     Motor: No weakness.  Psychiatric:        Mood and Affect: Mood normal.     ED Results / Procedures / Treatments   Labs (all labs ordered are listed, but only abnormal results are displayed) Labs Reviewed  BASIC METABOLIC PANEL - Abnormal; Notable for the following components:      Result Value   Glucose, Bld 112 (*)    Creatinine, Ser 1.31 (*)    Calcium 8.6 (*)    GFR, Estimated 58 (*)    All other components within normal limits  CBC - Abnormal; Notable for the following components:   RBC 4.13 (*)    Hemoglobin 12.2 (*)    HCT 36.4 (*)    All other components within normal limits  TROPONIN I (HIGH SENSITIVITY)  TROPONIN I (HIGH SENSITIVITY)    EKG EKG Interpretation Date/Time:  Friday December 09 2023 09:17:26 EST Ventricular Rate:  70 PR  Interval:  179 QRS Duration:  80 QT Interval:  393 QTC Calculation: 424 R Axis:   -7  Text Interpretation: Sinus rhythm Minimal ST elevation, inferior leads Confirmed by Rolan Bucco [phone]) on 12/09/2023 9:53:17 AM  Radiology CT Angio Chest Aorta W and/or Wo Contrast Result Date: 12/09/2023 CLINICAL DATA:  Chest pain. EXAM: CT ANGIOGRAPHY CHEST WITH CONTRAST TECHNIQUE: Multidetector CT imaging of the chest was performed using the standard protocol during bolus administration of intravenous contrast. Multiplanar CT image reconstructions and MIPs were obtained to evaluate the vascular anatomy. RADIATION DOSE REDUCTION: This exam was performed according to the departmental dose-optimization program which includes automated exposure control, adjustment of the mA and/or kV according to patient size and/or use of iterative reconstruction technique. CONTRAST:  75mL OMNIPAQUE IOHEXOL 350 MG/ML SOLN COMPARISON:  None Available. FINDINGS: Cardiovascular: Satisfactory opacification of the pulmonary arteries to the segmental level. No evidence of pulmonary embolism. Normal heart size. Minimal pericardial effusion. No evidence of thoracic aortic aneurysm. Mediastinum/Nodes: No enlarged mediastinal, hilar, or axillary lymph nodes. Thyroid gland, trachea, and esophagus demonstrate no significant findings. Lungs/Pleura: Lungs are clear. No pleural effusion or pneumothorax. Upper Abdomen: Probable right hepatic cyst. Musculoskeletal: No chest wall abnormality. No acute or significant osseous findings. Review of the MIP images confirms the above findings. IMPRESSION: No definite evidence of pulmonary embolus. Minimal pericardial effusion. Electronically Signed   By: Lupita Raider M.D.   On: 12/09/2023 11:49   DG Chest 2 View Result Date: 12/09/2023 CLINICAL DATA:  Chest pain. EXAM: CHEST - 2 VIEW COMPARISON:  Chest radiograph dated 09/30/2020. FINDINGS: The heart size and mediastinal contours are within normal  limits. Mild left basilar atelectasis/scarring. No focal consolidation, sizeable pleural effusion, or pneumothorax. No acute osseous abnormality. IMPRESSION: Mild left basilar atelectasis/scarring. Otherwise, no acute cardiopulmonary findings. Electronically Signed   By: Hart Robinsons M.D.   On: 12/09/2023 09:11    Procedures Procedures    Medications Ordered in ED Medications  iohexol (OMNIPAQUE) 350 MG/ML injection 75 mL (75 mLs Intravenous Contrast Given 12/09/23 1004)  sodium chloride 0.9 % bolus 500 mL (0 mLs Intravenous Stopped 12/09/23 1418)  famotidine (PEPCID) IVPB 20 mg premix (0 mg Intravenous Stopped 12/09/23 1335)    ED Course/ Medical Decision Making/ A&P             HEART Score: 4                    Medical Decision Making Amount and/or Complexity of Data Reviewed Labs: ordered.  Radiology: ordered.  Risk Prescription drug management.   This patient is a 73 year old male who presents to the ED for concern of central chest pain 1 day accompanied with bilateral hand and feet swelling.   Differential diagnoses prior to evaluation: The emergent differential diagnosis includes, but is not limited to, ACS, PE, pneumonia, aortic dissection, GERD, muscle strain, Boerhaave's,. This is not an exhaustive differential.   Past Medical History / Co-morbidities / Social History: HTN, GERD, IBS, aortic ectasia, CKD.  Additional history: Chart reviewed. Pertinent results include:   Last seen by cardiology on 10/24/2023 for chronic asymptomatic sinus bradycardia.  Noted to have had last stress test done on 09/17/2018 revealing mild inferior ischemia.  Lab Tests/Imaging studies: I personally interpreted labs/imaging and the pertinent results include:   CBC shows a mild anemia with a hemoglobin of 12.2 BMP shows elevated creatinine of 1.31 decreased from previous 10 months ago 1.35, mildly decreased GFR of 58, mild hypocalcemia of 8.6 Troponin and delta troponin both  unremarkable Chest x-ray shows mild left Basilar atelectasis/scarring with no other acute cardiopulmonary findings.  CT angio of aorta with without contrast did not show any signs of PE or aortic dissection.  Unremarkable I agree with the radiologist interpretation.  Cardiac monitoring: EKG obtained and interpreted by myself and attending physician which shows: Sinus rhythm with possible underlying ST elevation in inferior leads.  EKG Interpretation Date/Time:  Friday December 09 2023 09:17:26 EST Ventricular Rate:  70 PR Interval:  179 QRS Duration:  80 QT Interval:  393 QTC Calculation: 424 R Axis:   -7  Text Interpretation: Sinus rhythm Minimal ST elevation, inferior leads Confirmed by Rolan Bucco [phone]) on 12/09/2023 9:53:17 AM          Medications: I ordered medication including famotidine and normal saline.  I have reviewed the patients home medicines and have made adjustments as needed.  ED Course:   73 year old male presents the ED today complaining of a 1 day history of chest pain that radiates to left arm and up the neck accompanied with a 1 week history of hand and foot pain with swelling, particularly worse at the dorsal aspects of foot and ankle, fingers and left wrist.  Previous medical history of aortic ectasia, GERD, HTN, CKD.  Last noted to see cardiology on 10/24/2023 for chronic sinus bradycardia.  Last stress test done in 2019 which showed mild inferior ischemia.  Recently stopped taking his statin due to concerns for calf cramping which is since improved since last month after he stopped taking medication.  Denies shortness of breath, exertional symptoms, recent surgeries, cancer, long trips.   Lungs to LCTAB, no murmur heard, no chest tenderness. Exam is notable for mild tenderness noted to left wrist, ankles bilaterally, dorsal aspects of feet bilaterally.  Full ROM present.  No erythema.  CBC shows a mild anemia with hemoglobin 12.2, BMP shows a mildly  decreased kidney function with a creatinine of 1.31 which is decreased from 1.35 9 months ago, mildly decreased GFR of 58.  However troponin and delta troponin were stable and unremarkable at 4.  Chest x-ray was unremarkable.  However EKG and repeat EKG were done due to slightly increased ST elevation which was changed from previous.  CT angio of the aorta was also done due to previous history of aortic ectasia without any previous imaging noted in his chart as well as to possibly evaluate for any obvious PEs.  Patient was provided famotidine as well as some fluid due to  some slight hypotension and mildly decreased kidney function as well is to see if it helped decrease chest pain.  Upon reevaluation patient stated that his chest pain has since been improving.  Consulted with Dr. Antoine Poche who said that this patient could follow-up outpatient as he appears to be low risk with lab findings, exam, imaging.  Due to having also consulted cardiology and with reassuring lab work and imaging, I believe this patient is to be discharged at this time and followed up outpatient.  Was previous patient of Dr. Jacinto Halim and recommend that he schedule a follow-up visit for reevaluation.  Provided strict return to ED precautions.   Disposition: After consideration of the diagnostic results and the patients response to treatment, I feel that the patient will benefit from discharge and treatment as above.   emergency department workup does not suggest an emergent condition requiring admission or immediate intervention beyond what has been performed at this time. The plan is: Follow-up with cardiology, return for any new or worsening symptoms. The patient is safe for discharge and has been instructed to return immediately for worsening symptoms, change in symptoms or any other concerns.  Final Clinical Impression(s) / ED Diagnoses Final diagnoses:  Chest pain, unspecified type    Rx / DC Orders ED Discharge Orders      None         Lunette Stands, PA-C 12/09/23 1435    Rolan Bucco, MD 12/09/23 1444

## 2023-12-09 NOTE — ED Notes (Signed)
 Reviewed AVS/discharge instruction with patient. Time allotted for and all questions answered. Patient is agreeable for d/c and escorted to ed exit by staff.

## 2023-12-09 NOTE — ED Notes (Signed)
 ED Provider at bedside.

## 2023-12-09 NOTE — ED Triage Notes (Signed)
 C/o central CP starting last night. Rads into left arm. States bilateral hand and feet swelling starting yesterday. Also states he is "unable to move left wrist" d/t to pain. Denies SHOB.

## 2023-12-09 NOTE — Discharge Instructions (Signed)
 You were seen today for chest pain that started yesterday.  Your labs and imaging as well as physical exam were very reassuring today that gives me low suspicion that any emergent pathology is present at this time.  Recommend that you follow-up with cardiology within the next week for further evaluation outpatient as this is atypical chest pain for you.  Talk to cardiology as well today who agreed that you would be stable enough to follow-up outpatient.  However if you begin to have any new or worsening symptoms including worsening chest pain, shortness of breath, dizziness, vertigo, confusion return to the ED immediately for further evaluation.

## 2023-12-15 DIAGNOSIS — E789 Disorder of lipoprotein metabolism, unspecified: Secondary | ICD-10-CM | POA: Diagnosis not present

## 2023-12-15 DIAGNOSIS — I700 Atherosclerosis of aorta: Secondary | ICD-10-CM | POA: Diagnosis not present

## 2023-12-15 DIAGNOSIS — R5381 Other malaise: Secondary | ICD-10-CM | POA: Diagnosis not present

## 2023-12-15 DIAGNOSIS — D649 Anemia, unspecified: Secondary | ICD-10-CM | POA: Diagnosis not present

## 2023-12-15 DIAGNOSIS — Z125 Encounter for screening for malignant neoplasm of prostate: Secondary | ICD-10-CM | POA: Diagnosis not present

## 2023-12-20 ENCOUNTER — Encounter: Payer: Self-pay | Admitting: Cardiology

## 2023-12-22 DIAGNOSIS — E7800 Pure hypercholesterolemia, unspecified: Secondary | ICD-10-CM | POA: Diagnosis not present

## 2023-12-22 DIAGNOSIS — K449 Diaphragmatic hernia without obstruction or gangrene: Secondary | ICD-10-CM | POA: Diagnosis not present

## 2023-12-22 DIAGNOSIS — K219 Gastro-esophageal reflux disease without esophagitis: Secondary | ICD-10-CM | POA: Diagnosis not present

## 2023-12-22 DIAGNOSIS — M2550 Pain in unspecified joint: Secondary | ICD-10-CM | POA: Diagnosis not present

## 2023-12-22 DIAGNOSIS — I10 Essential (primary) hypertension: Secondary | ICD-10-CM | POA: Diagnosis not present

## 2023-12-22 DIAGNOSIS — Z0000 Encounter for general adult medical examination without abnormal findings: Secondary | ICD-10-CM | POA: Diagnosis not present

## 2023-12-22 DIAGNOSIS — R972 Elevated prostate specific antigen [PSA]: Secondary | ICD-10-CM | POA: Diagnosis not present

## 2023-12-29 DIAGNOSIS — D2272 Melanocytic nevi of left lower limb, including hip: Secondary | ICD-10-CM | POA: Diagnosis not present

## 2023-12-29 DIAGNOSIS — D225 Melanocytic nevi of trunk: Secondary | ICD-10-CM | POA: Diagnosis not present

## 2023-12-29 DIAGNOSIS — L821 Other seborrheic keratosis: Secondary | ICD-10-CM | POA: Diagnosis not present

## 2023-12-29 DIAGNOSIS — D2262 Melanocytic nevi of left upper limb, including shoulder: Secondary | ICD-10-CM | POA: Diagnosis not present

## 2023-12-29 DIAGNOSIS — D485 Neoplasm of uncertain behavior of skin: Secondary | ICD-10-CM | POA: Diagnosis not present

## 2023-12-29 DIAGNOSIS — L905 Scar conditions and fibrosis of skin: Secondary | ICD-10-CM | POA: Diagnosis not present

## 2023-12-29 DIAGNOSIS — Z86018 Personal history of other benign neoplasm: Secondary | ICD-10-CM | POA: Diagnosis not present

## 2023-12-29 DIAGNOSIS — D1724 Benign lipomatous neoplasm of skin and subcutaneous tissue of left leg: Secondary | ICD-10-CM | POA: Diagnosis not present

## 2023-12-29 DIAGNOSIS — D2261 Melanocytic nevi of right upper limb, including shoulder: Secondary | ICD-10-CM | POA: Diagnosis not present

## 2023-12-29 DIAGNOSIS — L570 Actinic keratosis: Secondary | ICD-10-CM | POA: Diagnosis not present

## 2023-12-29 DIAGNOSIS — Z85828 Personal history of other malignant neoplasm of skin: Secondary | ICD-10-CM | POA: Diagnosis not present

## 2023-12-29 DIAGNOSIS — M25449 Effusion, unspecified hand: Secondary | ICD-10-CM | POA: Diagnosis not present

## 2023-12-29 DIAGNOSIS — L578 Other skin changes due to chronic exposure to nonionizing radiation: Secondary | ICD-10-CM | POA: Diagnosis not present

## 2024-01-13 DIAGNOSIS — M2540 Effusion, unspecified joint: Secondary | ICD-10-CM | POA: Diagnosis not present

## 2024-01-13 DIAGNOSIS — M7918 Myalgia, other site: Secondary | ICD-10-CM | POA: Diagnosis not present

## 2024-01-13 DIAGNOSIS — M79642 Pain in left hand: Secondary | ICD-10-CM | POA: Diagnosis not present

## 2024-01-13 DIAGNOSIS — M79641 Pain in right hand: Secondary | ICD-10-CM | POA: Diagnosis not present

## 2024-01-13 DIAGNOSIS — E663 Overweight: Secondary | ICD-10-CM | POA: Diagnosis not present

## 2024-01-13 DIAGNOSIS — Z6829 Body mass index (BMI) 29.0-29.9, adult: Secondary | ICD-10-CM | POA: Diagnosis not present

## 2024-01-13 DIAGNOSIS — M2560 Stiffness of unspecified joint, not elsewhere classified: Secondary | ICD-10-CM | POA: Diagnosis not present

## 2024-01-23 DIAGNOSIS — R768 Other specified abnormal immunological findings in serum: Secondary | ICD-10-CM | POA: Diagnosis not present

## 2024-01-23 DIAGNOSIS — I10 Essential (primary) hypertension: Secondary | ICD-10-CM | POA: Diagnosis not present

## 2024-01-23 DIAGNOSIS — K219 Gastro-esophageal reflux disease without esophagitis: Secondary | ICD-10-CM | POA: Diagnosis not present

## 2024-01-24 DIAGNOSIS — R351 Nocturia: Secondary | ICD-10-CM | POA: Diagnosis not present

## 2024-01-24 DIAGNOSIS — R972 Elevated prostate specific antigen [PSA]: Secondary | ICD-10-CM | POA: Diagnosis not present

## 2024-01-24 DIAGNOSIS — R3915 Urgency of urination: Secondary | ICD-10-CM | POA: Diagnosis not present

## 2024-01-24 DIAGNOSIS — N401 Enlarged prostate with lower urinary tract symptoms: Secondary | ICD-10-CM | POA: Diagnosis not present

## 2024-01-25 DIAGNOSIS — E663 Overweight: Secondary | ICD-10-CM | POA: Diagnosis not present

## 2024-01-25 DIAGNOSIS — M0609 Rheumatoid arthritis without rheumatoid factor, multiple sites: Secondary | ICD-10-CM | POA: Diagnosis not present

## 2024-01-25 DIAGNOSIS — M79642 Pain in left hand: Secondary | ICD-10-CM | POA: Diagnosis not present

## 2024-01-25 DIAGNOSIS — M79641 Pain in right hand: Secondary | ICD-10-CM | POA: Diagnosis not present

## 2024-01-25 DIAGNOSIS — Z6829 Body mass index (BMI) 29.0-29.9, adult: Secondary | ICD-10-CM | POA: Diagnosis not present

## 2024-01-25 DIAGNOSIS — M2560 Stiffness of unspecified joint, not elsewhere classified: Secondary | ICD-10-CM | POA: Diagnosis not present

## 2024-01-25 DIAGNOSIS — M2540 Effusion, unspecified joint: Secondary | ICD-10-CM | POA: Diagnosis not present

## 2024-01-26 ENCOUNTER — Encounter: Payer: Self-pay | Admitting: Cardiology

## 2024-01-30 ENCOUNTER — Encounter: Payer: Self-pay | Admitting: Gastroenterology

## 2024-01-30 ENCOUNTER — Other Ambulatory Visit: Payer: Self-pay | Admitting: Urology

## 2024-01-30 DIAGNOSIS — R972 Elevated prostate specific antigen [PSA]: Secondary | ICD-10-CM

## 2024-02-21 ENCOUNTER — Ambulatory Visit: Attending: Cardiology | Admitting: Cardiology

## 2024-02-21 ENCOUNTER — Encounter: Payer: Self-pay | Admitting: Cardiology

## 2024-02-21 VITALS — BP 138/62 | HR 67 | Resp 16 | Ht 67.0 in | Wt 189.0 lb

## 2024-02-21 DIAGNOSIS — I10 Essential (primary) hypertension: Secondary | ICD-10-CM | POA: Insufficient documentation

## 2024-02-21 DIAGNOSIS — R001 Bradycardia, unspecified: Secondary | ICD-10-CM | POA: Diagnosis present

## 2024-02-21 DIAGNOSIS — N1831 Chronic kidney disease, stage 3a: Secondary | ICD-10-CM | POA: Diagnosis present

## 2024-02-21 NOTE — Progress Notes (Unsigned)
 Cardiology Office Note:  .   Date:  02/22/2024  ID:  John Bird, DOB 1951-05-02, MRN [id number] PCP: Jhon Moselle, FNP  [facility] Providers Cardiologist:  Knox Perl, MD   History of Present Illness: .   John Bird is a 73 y.o. Caucasian  male  with chronic asymptomatic sinus bradycardia, hyperlipidemia,  prior 10-15-pack-year  cigarette use, quit remotely, mild aortic ectasia, abdominal aortic atherosclerosis by CT scan, family history of premature coronary artery disease in his brother having MI at age 60Y, chronic stable angina and DOE with nuclear stress test on 09/17/2018 revealing mild inferior ischemia.     Discussed the use of AI scribe software for clinical note transcription with the patient, who gave verbal consent to proceed.  History of Present Illness John Bird is a 73 year old male with rheumatoid arthritis who presents with concerns about hypertension and medication management.  In February, he experienced chest pain and underwent a comprehensive evaluation in the emergency department, including a CT scan. Since then, he has not experienced further chest pain. He is on hydralazine  for blood pressure management, taking two doses daily instead of the prescribed three due to episodes of low blood pressure and dizziness. He monitors his symptoms and adjusts his activity accordingly.  Labs   Lab Results  Component Value Date   CHOL 133 12/26/2018   HDL 41 12/26/2018   LDLCALC 79 12/26/2018   TRIG 67 12/26/2018   Lab Results  Component Value Date   NA 136 12/09/2023   K 3.9 12/09/2023   CO2 23 12/09/2023   GLUCOSE 112 (H) 12/09/2023   BUN 22 12/09/2023   CREATININE 1.31 (H) 12/09/2023   CALCIUM 8.6 (L) 12/09/2023   GFR 58.41 (L) 08/07/2019   EGFR 59.0 07/07/2023   GFRNONAA 58 (L) 12/09/2023      Latest Ref Rng & Units 12/09/2023    7:16 AM 01/25/2023   10:51 AM 01/11/2023   12:04 AM  BMP  Glucose 70 - 99 mg/dL 409  94  811   BUN 8 - 23  mg/dL 22  22  25    Creatinine 0.61 - 1.24 mg/dL 9.14  7.82  9.56   BUN/Creat Ratio 10 - 24  16    Sodium 135 - 145 mmol/L 136  138  137   Potassium 3.5 - 5.1 mmol/L 3.9  4.8  4.3   Chloride 98 - 111 mmol/L 105  103  107   CO2 22 - 32 mmol/L 23  22  21    Calcium 8.9 - 10.3 mg/dL 8.6  8.9  8.9       Latest Ref Rng & Units 12/09/2023    7:16 AM 01/11/2023   12:04 AM 02/22/2022   11:37 AM  CBC  WBC 4.0 - 10.5 K/uL 9.2  7.8  6.3   Hemoglobin 13.0 - 17.0 g/dL 21.3  08.6  57.8   Hematocrit 39.0 - 52.0 % 36.4  40.1  39.5   Platelets 150 - 400 K/uL 267  195  248     External Labs:  Labs 07/07/2023:   Hb 13.2/HCT 40.0, platelets 267, normal indices.   Serum glucose 92 mg, BUN 24, creatinine 1.29, EGFR 59 mL, sodium 135, potassium 4.1, LFTs normal.   Total cholesterol 190, triglycerides 96, HDL 44, LDL: 28.   A1c 5.3%.  ROS  Review of Systems  Cardiovascular:  Negative for chest pain, dyspnea on exertion and leg swelling.   Physical Exam:  VS:  BP 138/62 (BP Location: Right Arm, Patient Position: Sitting, Cuff Size: Normal)   Pulse 67   Resp 16   Ht 5\' 7"  (1.702 m)   Wt 189 lb (85.7 kg)   SpO2 93%   BMI 29.60 kg/m    Wt Readings from Last 3 Encounters:  02/21/24 189 lb (85.7 kg)  12/09/23 185 lb (83.9 kg)  10/24/23 190 lb (86.2 kg)    Physical Exam Neck:     Vascular: No carotid bruit or JVD.  Cardiovascular:     Rate and Rhythm: Normal rate and regular rhythm.     Pulses: Intact distal pulses.     Heart sounds: Normal heart sounds. No murmur heard.    No gallop.  Pulmonary:     Effort: Pulmonary effort is normal.     Breath sounds: Normal breath sounds.  Abdominal:     General: Bowel sounds are normal.     Palpations: Abdomen is soft.  Musculoskeletal:     Right lower leg: No edema.     Left lower leg: No edema.    Studies Reviewed: Aaron Aas     EKG:         Medications and allergies    No Known Allergies   Current Outpatient Medications:    hydrALAZINE   (APRESOLINE ) 50 MG tablet, TAKE 1 TABLET BY MOUTH THREE TIMES A DAY (Patient taking differently: Take 50 mg by mouth 2 (two) times daily.), Disp: 270 tablet, Rfl: 3   hydroxychloroquine (PLAQUENIL) 200 MG tablet, Take 200 mg by mouth 2 (two) times daily., Disp: , Rfl:    omeprazole  (PRILOSEC OTC) 20 MG tablet, Take 20 mg by mouth daily. mwf, Disp: , Rfl:    valsartan -hydrochlorothiazide  (DIOVAN -HCT) 320-12.5 MG tablet, TAKE 1 TABLET BY MOUTH EVERY DAY IN THE MORNING, Disp: 90 tablet, Rfl: 3   No orders of the defined types were placed in this encounter.    There are no discontinued medications.   ASSESSMENT AND PLAN: .      ICD-10-CM   1. Essential (primary) hypertension  I10     2. Stage 3a chronic kidney disease (HCC)  N18.31     3. Bradycardia by electrocardiogram  R00.1      Assessment & Plan  Hypertension   Hypertension is well-controlled on the current medication regimen. including valsartan  HCT 320/12.5 mg in the morning and hydralazine  50 mg p.o. twice daily.  He reports episodes of hypotension and dizziness, leading to a reduction in hydralazine  dosage from three to two times daily. The current approach is effective despite variability. Continue hydralazine  dosage of two times daily. Monitor blood pressure and symptoms of dizziness. Advise adjusting activity if experiencing hypotensive symptoms.  Rheumatoid arthritis   Rheumatoid arthritis, diagnosed by a rheumatologist, presents with migratory pain in wrists, ankles, feet, and fingers, along with intermittent swelling. It is currently managed with hydroxychloroquine, which is expected to stabilize the condition. Pain levels vary. Discussed potential progression and treatment options, including methotrexate and biologics, if needed. Continue hydroxychloroquine as prescribed, taking it in the morning to avoid interaction with antacids. Allow occasional use of acetaminophen for pain management. Permit short-term use of naproxen or  ibuprofen for severe pain, with caution regarding blood pressure and renal function. Monitor symptoms and adjust treatment as needed.   Signed,  Knox Perl, MD, [facility] 02/22/2024, 7:29 AM [facility] [address] [place], [place] [address] Phone: [phone]. Fax:  [phone]

## 2024-02-21 NOTE — Patient Instructions (Signed)

## 2024-02-27 DIAGNOSIS — E119 Type 2 diabetes mellitus without complications: Secondary | ICD-10-CM | POA: Diagnosis not present

## 2024-02-27 DIAGNOSIS — H2513 Age-related nuclear cataract, bilateral: Secondary | ICD-10-CM | POA: Diagnosis not present

## 2024-02-27 DIAGNOSIS — H35033 Hypertensive retinopathy, bilateral: Secondary | ICD-10-CM | POA: Diagnosis not present

## 2024-02-27 DIAGNOSIS — H53143 Visual discomfort, bilateral: Secondary | ICD-10-CM | POA: Diagnosis not present

## 2024-03-09 ENCOUNTER — Ambulatory Visit
Admission: RE | Admit: 2024-03-09 | Discharge: 2024-03-09 | Disposition: A | Discharge status: Home / Self Care | Source: Ambulatory Visit | Attending: Urology | Admitting: Urology

## 2024-03-09 DIAGNOSIS — R972 Elevated prostate specific antigen [PSA]: Secondary | ICD-10-CM

## 2024-03-09 MED ORDER — GADOPICLENOL 0.5 MMOL/ML IV SOLN
8.0000 mL | Freq: Once | INTRAVENOUS | Status: AC | PRN
Start: 2024-03-09 — End: 2024-03-09
  Administered 2024-03-09: 8 mL via INTRAVENOUS

## 2024-04-02 DIAGNOSIS — J029 Acute pharyngitis, unspecified: Secondary | ICD-10-CM | POA: Diagnosis not present

## 2024-04-25 DIAGNOSIS — M2540 Effusion, unspecified joint: Secondary | ICD-10-CM | POA: Diagnosis not present

## 2024-04-25 DIAGNOSIS — M79642 Pain in left hand: Secondary | ICD-10-CM | POA: Diagnosis not present

## 2024-04-25 DIAGNOSIS — Z6828 Body mass index (BMI) 28.0-28.9, adult: Secondary | ICD-10-CM | POA: Diagnosis not present

## 2024-04-25 DIAGNOSIS — M2560 Stiffness of unspecified joint, not elsewhere classified: Secondary | ICD-10-CM | POA: Diagnosis not present

## 2024-04-25 DIAGNOSIS — M0609 Rheumatoid arthritis without rheumatoid factor, multiple sites: Secondary | ICD-10-CM | POA: Diagnosis not present

## 2024-04-25 DIAGNOSIS — M79641 Pain in right hand: Secondary | ICD-10-CM | POA: Diagnosis not present

## 2024-04-25 DIAGNOSIS — E663 Overweight: Secondary | ICD-10-CM | POA: Diagnosis not present

## 2024-04-26 DIAGNOSIS — R972 Elevated prostate specific antigen [PSA]: Secondary | ICD-10-CM | POA: Diagnosis not present

## 2024-04-27 DIAGNOSIS — R972 Elevated prostate specific antigen [PSA]: Secondary | ICD-10-CM | POA: Diagnosis not present

## 2024-05-07 NOTE — Progress Notes (Unsigned)
 Ellouise Console, PA-C [address] [place], [place]  [address] Phone: [phone]   Primary Care Physician: Royden Ronal Czar, FNP  Primary Gastroenterologist:  Ellouise Console, PA-C / Elspeth Naval, MD   Chief Complaint: Discuss early colonoscopy, EGD       HPI:   John Bird is a 74 y.o. male  Current Outpatient Medications  Medication Sig Dispense Refill   hydrALAZINE  (APRESOLINE ) 50 MG tablet TAKE 1 TABLET BY MOUTH THREE TIMES A DAY (Patient taking differently: Take 50 mg by mouth 2 (two) times daily.) 270 tablet 3   hydroxychloroquine (PLAQUENIL) 200 MG tablet Take 200 mg by mouth 2 (two) times daily.     omeprazole  (PRILOSEC OTC) 20 MG tablet Take 20 mg by mouth daily. mwf     valsartan -hydrochlorothiazide  (DIOVAN -HCT) 320-12.5 MG tablet TAKE 1 TABLET BY MOUTH EVERY DAY IN THE MORNING 90 tablet 3   No current facility-administered medications for this visit.    Allergies as of 05/08/2024   (No Known Allergies)    Past Medical History:  Diagnosis Date   Allergy    Anal fissure    Cancer (HCC)    basal cell CA- forehead   DDD (degenerative disc disease), cervical    Hemorrhoids    Hernia, inguinal    Hiatal hernia    Hiatal hernia    Hypertension    IBS (irritable bowel syndrome)    Prostate atrophy 2023   Ulcer     Past Surgical History:  Procedure Laterality Date   FINGER SURGERY     Left Pinky finger   INGUINAL HERNIA REPAIR     bilateral   MOHS SURGERY     top of his head   NASAL SEPTUM SURGERY     right knee surgery     TONSILLECTOMY AND ADENOIDECTOMY     TONSILLECTOMY AND ADENOIDECTOMY      Review of Systems:    All systems reviewed and negative except where noted in HPI.    Physical Exam:  There were no vitals taken for this visit. No LMP for male patient.  General: Well-nourished, well-developed in no acute distress.  Lungs: Clear to auscultation bilaterally. Non-labored. Heart: Regular rate and rhythm, no murmurs rubs  or gallops.  Abdomen: Bowel sounds are normal; Abdomen is Soft; No hepatosplenomegaly, masses or hernias;  No Abdominal Tenderness; No guarding or rebound tenderness. Neuro: Alert and oriented x 3.  Grossly intact.  Psych: Alert and cooperative, normal mood and affect.   Imaging Studies: No results found.  Labs: CBC    Component Value Date/Time   WBC 9.2 12/09/2023 0716   RBC 4.13 (L) 12/09/2023 0716   HGB 12.2 (L) 12/09/2023 0716   HCT 36.4 (L) 12/09/2023 0716   PLT 267 12/09/2023 0716   MCV 88.1 12/09/2023 0716   MCH 29.5 12/09/2023 0716   MCHC 33.5 12/09/2023 0716   RDW 13.0 12/09/2023 0716   LYMPHSABS 2.1 01/11/2023 0004   MONOABS 1.0 01/11/2023 0004   EOSABS 0.5 01/11/2023 0004   BASOSABS 0.1 01/11/2023 0004    CMP     Component Value Date/Time   NA 136 12/09/2023 0716   NA 138 01/25/2023 1051   K 3.9 12/09/2023 0716   CL 105 12/09/2023 0716   CO2 23 12/09/2023 0716   GLUCOSE 112 (H) 12/09/2023 0716   BUN 22 12/09/2023 0716   BUN 22 01/25/2023 1051   CREATININE 1.31 (H) 12/09/2023 0716   CALCIUM 8.6 (L)  12/09/2023 0716   PROT 7.2 02/22/2022 1137   PROT 6.6 12/26/2018 0958   ALBUMIN 4.4 02/22/2022 1137   ALBUMIN 4.3 12/26/2018 0958   AST 20 02/22/2022 1137   ALT 16 02/22/2022 1137   ALKPHOS 76 02/22/2022 1137   BILITOT 0.9 02/22/2022 1137   BILITOT 0.7 12/26/2018 0958   GFRNONAA 58 (L) 12/09/2023 0716   GFRAA 60 05/22/2020 1047       Assessment and Plan:   John Bird is a 73 y.o. y/o male ***    Ellouise Console, PA-C  Follow up ***

## 2024-05-08 ENCOUNTER — Ambulatory Visit (INDEPENDENT_AMBULATORY_CARE_PROVIDER_SITE_OTHER): Admitting: Physician Assistant

## 2024-05-08 ENCOUNTER — Other Ambulatory Visit (INDEPENDENT_AMBULATORY_CARE_PROVIDER_SITE_OTHER)

## 2024-05-08 ENCOUNTER — Encounter: Payer: Self-pay | Admitting: Physician Assistant

## 2024-05-08 ENCOUNTER — Other Ambulatory Visit: Payer: Self-pay

## 2024-05-08 VITALS — BP 138/64 | HR 62 | Ht 67.0 in | Wt 184.2 lb

## 2024-05-08 DIAGNOSIS — K2100 Gastro-esophageal reflux disease with esophagitis, without bleeding: Secondary | ICD-10-CM

## 2024-05-08 DIAGNOSIS — K625 Hemorrhage of anus and rectum: Secondary | ICD-10-CM | POA: Diagnosis not present

## 2024-05-08 DIAGNOSIS — K648 Other hemorrhoids: Secondary | ICD-10-CM

## 2024-05-08 LAB — BASIC METABOLIC PANEL WITH GFR
BUN: 17 mg/dL (ref 6–23)
CO2: 27 meq/L (ref 19–32)
Calcium: 8.7 mg/dL (ref 8.4–10.5)
Chloride: 103 meq/L (ref 96–112)
Creatinine, Ser: 1.38 mg/dL (ref 0.40–1.50)
GFR: 50.77 mL/min — ABNORMAL LOW (ref >60.00)
Glucose, Bld: 100 mg/dL — ABNORMAL HIGH (ref 70–99)
Potassium: 4.2 meq/L (ref 3.5–5.1)
Sodium: 138 meq/L (ref 135–145)

## 2024-05-08 LAB — CBC WITH DIFFERENTIAL/PLATELET
Basophils Absolute: 0 K/uL (ref 0.0–0.1)
Basophils Relative: 0.6 % (ref 0.0–3.0)
Eosinophils Absolute: 0.3 K/uL (ref 0.0–0.7)
Eosinophils Relative: 4 % (ref 0.0–5.0)
HCT: 33.8 % — ABNORMAL LOW (ref 39.0–52.0)
Hemoglobin: 11.2 g/dL — ABNORMAL LOW (ref 13.0–17.0)
Lymphocytes Relative: 16.3 % (ref 12.0–46.0)
Lymphs Abs: 1.3 K/uL (ref 0.7–4.0)
MCHC: 33.2 g/dL (ref 30.0–36.0)
MCV: 87.1 fl (ref 78.0–100.0)
Monocytes Absolute: 0.7 K/uL (ref 0.1–1.0)
Monocytes Relative: 9.2 % (ref 3.0–12.0)
Neutro Abs: 5.4 K/uL (ref 1.4–7.7)
Neutrophils Relative %: 69.9 % (ref 43.0–77.0)
Platelets: 301 K/uL (ref 150.0–400.0)
RBC: 3.88 Mil/uL — ABNORMAL LOW (ref 4.22–5.81)
RDW: 15.2 % (ref 11.5–15.5)
WBC: 7.7 K/uL (ref 4.0–10.5)

## 2024-05-08 MED ORDER — NA SULFATE-K SULFATE-MG SULF 17.5-3.13-1.6 GM/177ML PO SOLN: 1.0000 | Freq: Once | ORAL | 0 refills | Status: AC

## 2024-05-08 MED ORDER — PANTOPRAZOLE SODIUM 40 MG PO TBEC: 40.0000 mg | DELAYED_RELEASE_TABLET | Freq: Every day | ORAL | 3 refills | Status: AC

## 2024-05-08 MED ORDER — HYDROCORTISONE ACETATE 25 MG RE SUPP: 25.0000 mg | Freq: Every day | RECTAL | 1 refills | Status: AC

## 2024-05-08 NOTE — Patient Instructions (Signed)
 Your provider has requested that you go to the basement level for lab work before leaving today. Press B on the elevator. The lab is located at the first door on the left as you exit the elevator  We have sent the following medications to your pharmacy for you to pick up at your convenience: Pantoprazole  40 mg once daily and Hydrocortisone  Suppositories 25 mg once at bedtime  Discontinue Omeprazole    You have been scheduled for an Endoscopy and Colonoscopy. Please follow the written instructions given to you at your visit today.  If you use inhalers (even only as needed), please bring them with you on the day of your procedure.  DO NOT TAKE 7 DAYS PRIOR TO TEST- Trulicity (dulaglutide) Ozempic, Wegovy (semaglutide) Mounjaro (tirzepatide) Bydureon Bcise (exanatide extended release)  DO NOT TAKE 1 DAY PRIOR TO YOUR TEST Rybelsus (semaglutide) Adlyxin (lixisenatide) Victoza (liraglutide) Byetta (exanatide) ___________________________________________________________________________  Please follow up sooner if symptoms increase or worsen __________________________________________________________________________  Due to recent changes in healthcare laws, you may see the results of your imaging and laboratory studies on MyChart before your provider has had a chance to review them.  We understand that in some cases there may be results that are confusing or concerning to you. Not all laboratory results come back in the same time frame and the provider may be waiting for multiple results in order to interpret others.  Please give us  48 hours in order for your provider to thoroughly review all the results before contacting the office for clarification of your results.   Thank you for trusting me with your gastrointestinal care!   Ellouise Console, PA-C _______________________________________________________  If your blood pressure at your visit was 140/90 or greater, please contact your primary  care physician to follow up on this.  _______________________________________________________  If you are age 73 or older, your body mass index should be between 23-30. Your Body mass index is 28.86 kg/m. If this is out of the aforementioned range listed, please consider follow up with your Primary Care Provider.  If you are age 73 or younger, your body mass index should be between 19-25. Your Body mass index is 28.86 kg/m. If this is out of the aformentioned range listed, please consider follow up with your Primary Care Provider.   ________________________________________________________  The [facility] providers would like to encourage you to use MYCHART to communicate with providers for non-urgent requests or questions.  Due to long hold times on the telephone, sending your provider a message by [facility] may be a faster and more efficient way to get a response.  Please allow 48 business hours for a response.  Please remember that this is for non-urgent requests.  _______________________________________________________

## 2024-05-09 ENCOUNTER — Telehealth: Payer: Self-pay | Admitting: Physician Assistant

## 2024-05-09 ENCOUNTER — Ambulatory Visit: Payer: Self-pay | Admitting: Physician Assistant

## 2024-05-09 DIAGNOSIS — D649 Anemia, unspecified: Secondary | ICD-10-CM

## 2024-05-09 NOTE — Telephone Encounter (Signed)
 Inbound call from patient in regards to lab results. Please advise.   Thank You

## 2024-05-09 NOTE — Telephone Encounter (Signed)
 Let pt know that as soon as the labs are reviewed by the provider he will be contacted with the results.

## 2024-05-09 NOTE — Progress Notes (Signed)
 I sent patient lab results through MyChart. Please schedule lab visit to check iron panel, ferritin, B12, and folate. Diagnosis anemia, unspecified.  I put lab orders in. Ellouise Console, PA-C

## 2024-05-09 NOTE — Progress Notes (Signed)
 Agree with assessment and plan as outlined.

## 2024-05-10 ENCOUNTER — Telehealth: Payer: Self-pay | Admitting: Gastroenterology

## 2024-05-10 NOTE — Telephone Encounter (Signed)
 Inbound call from patient stating he does not want to have labs done twice since he has an upcoming appt on 06/18/2024 with his PCP and they are going to run lab work as well. Patient would like to know if these labs need to be taken before his next appt with his PCP. Patient requesting a call back   Please advise  Thank you

## 2024-05-10 NOTE — Telephone Encounter (Signed)
 See note below from pt and advise regarding labs.

## 2024-05-10 NOTE — Telephone Encounter (Signed)
 Pt notified via mychart

## 2024-05-11 DIAGNOSIS — Z86018 Personal history of other benign neoplasm: Secondary | ICD-10-CM | POA: Diagnosis not present

## 2024-05-11 DIAGNOSIS — D2261 Melanocytic nevi of right upper limb, including shoulder: Secondary | ICD-10-CM | POA: Diagnosis not present

## 2024-05-11 DIAGNOSIS — L578 Other skin changes due to chronic exposure to nonionizing radiation: Secondary | ICD-10-CM | POA: Diagnosis not present

## 2024-05-11 DIAGNOSIS — D2262 Melanocytic nevi of left upper limb, including shoulder: Secondary | ICD-10-CM | POA: Diagnosis not present

## 2024-05-11 DIAGNOSIS — L570 Actinic keratosis: Secondary | ICD-10-CM | POA: Diagnosis not present

## 2024-05-11 DIAGNOSIS — D2272 Melanocytic nevi of left lower limb, including hip: Secondary | ICD-10-CM | POA: Diagnosis not present

## 2024-05-11 DIAGNOSIS — D485 Neoplasm of uncertain behavior of skin: Secondary | ICD-10-CM | POA: Diagnosis not present

## 2024-05-11 DIAGNOSIS — D225 Melanocytic nevi of trunk: Secondary | ICD-10-CM | POA: Diagnosis not present

## 2024-05-11 DIAGNOSIS — L905 Scar conditions and fibrosis of skin: Secondary | ICD-10-CM | POA: Diagnosis not present

## 2024-05-11 DIAGNOSIS — L821 Other seborrheic keratosis: Secondary | ICD-10-CM | POA: Diagnosis not present

## 2024-05-11 DIAGNOSIS — D1724 Benign lipomatous neoplasm of skin and subcutaneous tissue of left leg: Secondary | ICD-10-CM | POA: Diagnosis not present

## 2024-05-11 DIAGNOSIS — Z85828 Personal history of other malignant neoplasm of skin: Secondary | ICD-10-CM | POA: Diagnosis not present

## 2024-05-22 ENCOUNTER — Other Ambulatory Visit (INDEPENDENT_AMBULATORY_CARE_PROVIDER_SITE_OTHER)

## 2024-05-22 DIAGNOSIS — D649 Anemia, unspecified: Secondary | ICD-10-CM | POA: Diagnosis not present

## 2024-05-22 LAB — B12 AND FOLATE PANEL
Folate: >23.4 ng/mL (ref >5.9)
Vitamin B-12: 280 pg/mL (ref 211–911)

## 2024-05-23 ENCOUNTER — Ambulatory Visit: Payer: Self-pay | Admitting: Physician Assistant

## 2024-05-23 LAB — IRON,TIBC AND FERRITIN PANEL
%SAT: 22 % (ref 20–48)
Ferritin: 39 ng/mL (ref 24–380)
Iron: 80 ug/dL (ref 50–180)
TIBC: 365 ug/dL (ref 250–425)

## 2024-05-29 DIAGNOSIS — L988 Other specified disorders of the skin and subcutaneous tissue: Secondary | ICD-10-CM | POA: Diagnosis not present

## 2024-05-29 DIAGNOSIS — D485 Neoplasm of uncertain behavior of skin: Secondary | ICD-10-CM | POA: Diagnosis not present

## 2024-06-07 ENCOUNTER — Other Ambulatory Visit: Payer: Self-pay | Admitting: Cardiology

## 2024-06-07 DIAGNOSIS — I10 Essential (primary) hypertension: Secondary | ICD-10-CM

## 2024-06-18 DIAGNOSIS — N390 Urinary tract infection, site not specified: Secondary | ICD-10-CM | POA: Diagnosis not present

## 2024-06-18 DIAGNOSIS — I10 Essential (primary) hypertension: Secondary | ICD-10-CM | POA: Diagnosis not present

## 2024-06-18 DIAGNOSIS — E7800 Pure hypercholesterolemia, unspecified: Secondary | ICD-10-CM | POA: Diagnosis not present

## 2024-06-18 DIAGNOSIS — D649 Anemia, unspecified: Secondary | ICD-10-CM | POA: Diagnosis not present

## 2024-06-18 DIAGNOSIS — E789 Disorder of lipoprotein metabolism, unspecified: Secondary | ICD-10-CM | POA: Diagnosis not present

## 2024-06-18 DIAGNOSIS — R972 Elevated prostate specific antigen [PSA]: Secondary | ICD-10-CM | POA: Diagnosis not present

## 2024-06-18 LAB — LAB REPORT - SCANNED: EGFR: 52

## 2024-06-26 DIAGNOSIS — D649 Anemia, unspecified: Secondary | ICD-10-CM | POA: Diagnosis not present

## 2024-06-26 DIAGNOSIS — I700 Atherosclerosis of aorta: Secondary | ICD-10-CM | POA: Diagnosis not present

## 2024-06-26 DIAGNOSIS — I714 Abdominal aortic aneurysm, without rupture, unspecified: Secondary | ICD-10-CM | POA: Diagnosis not present

## 2024-06-26 DIAGNOSIS — R7989 Other specified abnormal findings of blood chemistry: Secondary | ICD-10-CM | POA: Diagnosis not present

## 2024-06-26 DIAGNOSIS — K219 Gastro-esophageal reflux disease without esophagitis: Secondary | ICD-10-CM | POA: Diagnosis not present

## 2024-06-26 DIAGNOSIS — M0600 Rheumatoid arthritis without rheumatoid factor, unspecified site: Secondary | ICD-10-CM | POA: Diagnosis not present

## 2024-06-26 DIAGNOSIS — I10 Essential (primary) hypertension: Secondary | ICD-10-CM | POA: Diagnosis not present

## 2024-06-26 DIAGNOSIS — R748 Abnormal levels of other serum enzymes: Secondary | ICD-10-CM | POA: Diagnosis not present

## 2024-06-27 ENCOUNTER — Telehealth: Payer: Self-pay | Admitting: Gastroenterology

## 2024-06-27 NOTE — Telephone Encounter (Signed)
 Received a call from patient stating he would like a nurse fu for in depth  discussion regarding Valsartan  for blood pressure, hydroxychloroquine and hydralazine . Pt scheduled for 9/24 and prefers callback at [phone]. Please review and advise   Thank you

## 2024-06-27 NOTE — Telephone Encounter (Signed)
 Pt wanted to know about taking his BP med the morning of his procedure. Let him know he could take it as early in the am with a small sip of water. He states he doesn't have to take it that early and he will just wait until after the procedure to take the BP med.

## 2024-07-04 ENCOUNTER — Encounter: Payer: Self-pay | Admitting: Gastroenterology

## 2024-07-04 ENCOUNTER — Ambulatory Visit: Admitting: Gastroenterology

## 2024-07-04 VITALS — BP 130/76 | HR 49 | Temp 98.1°F | Resp 10 | Ht 67.0 in | Wt 184.0 lb

## 2024-07-04 DIAGNOSIS — K5140 Inflammatory polyps of colon without complications: Secondary | ICD-10-CM | POA: Diagnosis not present

## 2024-07-04 DIAGNOSIS — K625 Hemorrhage of anus and rectum: Secondary | ICD-10-CM

## 2024-07-04 DIAGNOSIS — K317 Polyp of stomach and duodenum: Secondary | ICD-10-CM | POA: Diagnosis not present

## 2024-07-04 DIAGNOSIS — K602 Anal fissure, unspecified: Secondary | ICD-10-CM | POA: Diagnosis not present

## 2024-07-04 DIAGNOSIS — K2100 Gastro-esophageal reflux disease with esophagitis, without bleeding: Secondary | ICD-10-CM | POA: Diagnosis not present

## 2024-07-04 DIAGNOSIS — K449 Diaphragmatic hernia without obstruction or gangrene: Secondary | ICD-10-CM | POA: Diagnosis not present

## 2024-07-04 DIAGNOSIS — D120 Benign neoplasm of cecum: Secondary | ICD-10-CM | POA: Diagnosis not present

## 2024-07-04 DIAGNOSIS — K648 Other hemorrhoids: Secondary | ICD-10-CM | POA: Diagnosis not present

## 2024-07-04 DIAGNOSIS — K644 Residual hemorrhoidal skin tags: Secondary | ICD-10-CM | POA: Diagnosis not present

## 2024-07-04 DIAGNOSIS — I10 Essential (primary) hypertension: Secondary | ICD-10-CM | POA: Diagnosis not present

## 2024-07-04 DIAGNOSIS — K5730 Diverticulosis of large intestine without perforation or abscess without bleeding: Secondary | ICD-10-CM

## 2024-07-04 MED ORDER — SODIUM CHLORIDE 0.9 % IV SOLN: 500.0000 mL | Freq: Once | INTRAVENOUS | Status: AC

## 2024-07-04 MED ORDER — FAMOTIDINE 20 MG PO TABS: 20.0000 mg | ORAL_TABLET | Freq: Two times a day (BID) | ORAL | 3 refills | Status: AC

## 2024-07-04 NOTE — Op Note (Signed)
 [facility] Patient Name: John Bird Procedure Date: 07/04/2024 1:35 PM MRN: [id number] Endoscopist: Elspeth P. Leigh , MD, [id number] Age: 73 Referring MD:  Date of Birth: 1951/05/06 Gender: Male Account #: [id number] Procedure:                Colonoscopy Indications:              Rectal bleeding Medicines:                Monitored Anesthesia Care Procedure:                Pre-Anesthesia Assessment:                           - Prior to the procedure, a History and Physical                            was performed, and patient medications and                            allergies were reviewed. The patient's tolerance of                            previous anesthesia was also reviewed. The risks                            and benefits of the procedure and the sedation                            options and risks were discussed with the patient.                            All questions were answered, and informed consent                            was obtained. Prior Anticoagulants: The patient has                            taken no anticoagulant or antiplatelet agents. ASA                            Grade Assessment: II - A patient with mild systemic                            disease. After reviewing the risks and benefits,                            the patient was deemed in satisfactory condition to                            undergo the procedure.                           After obtaining informed consent, the colonoscope  was passed under direct vision. Throughout the                            procedure, the patient's blood pressure, pulse, and                            oxygen  saturations were monitored continuously. The                            CF [id number] #[id number] was introduced through the anus                            and advanced to the the terminal ileum, with                            identification of the appendiceal orifice  and IC                            valve. The colonoscopy was performed without                            difficulty. The patient tolerated the procedure                            well. The quality of the bowel preparation was                            good. The terminal ileum, ileocecal valve,                            appendiceal orifice, and rectum were photographed. Scope In: 1:52:18 PM Scope Out: 2:08:28 PM Scope Withdrawal Time: 0 hours 14 minutes 2 seconds  Total Procedure Duration: 0 hours 16 minutes 10 seconds  Findings:                 A posterior midline anal fissure was found on                            perianal exam, as well as hemorrhoids.                           The terminal ileum appeared normal.                           A 3 mm polyp was found in the cecum. The polyp was                            sessile. The polyp was removed with a cold snare.                            Resection and retrieval were complete.                           A few small-mouthed diverticula were  found in the                            sigmoid colon.                           Internal hemorrhoids were found.                           The exam was otherwise without abnormality. Complications:            No immediate complications. Estimated blood loss:                            Minimal. Estimated Blood Loss:     Estimated blood loss was minimal. Impression:               - Anal fissure found on perianal exam as well as                            hemorrhoids.                           - The examined portion of the ileum was normal.                           - One 3 mm polyp in the cecum, removed with a cold                            snare. Resected and retrieved.                           - Diverticulosis in the sigmoid colon.                           - Internal hemorrhoids.                           - The examination was otherwise normal.                           Recommend treatment  for anal fissure and see if                            symptoms resolve. If bleeding persists and fissure                            heals, would then consider hemorrhoid banding. Recommendation:           - Patient has a contact number available for                            emergencies. The signs and symptoms of potential                            delayed complications were discussed with the  patient. Return to normal activities tomorrow.                            Written discharge instructions were provided to the                            patient.                           - Resume previous diet.                           - Continue present medications.                           - Start daily fiber supplement - Citrucel or                            Benefiber                           - Start diltiazem / lidocaine ointment - pea sized                            amount PR TID for 4 weeks or until healed                           - Await pathology results.                           - Contact me if symptoms persist despite management                            of fissure Crislyn Willbanks P. Kamya Watling, MD 07/04/2024 2:14:41 PM This report has been signed electronically.

## 2024-07-04 NOTE — Op Note (Signed)
 [facility] Patient Name: John Bird Procedure Date: 07/04/2024 1:36 PM MRN: [id number] Endoscopist: Elspeth P. Leigh , MD, [id number] Age: 73 Referring MD:  Date of Birth: Nov 21, 1950 Gender: Male Account #: [id number] Procedure:                Upper GI endoscopy Indications:              Follow-up of esophageal reflux - had symptoms                            despite omeprazole , switched to protonix  but had                            not started it. In fact, due to CKD he states he                            stopped all PPI Medicines:                Monitored Anesthesia Care Procedure:                Pre-Anesthesia Assessment:                           - Prior to the procedure, a History and Physical                            was performed, and patient medications and                            allergies were reviewed. The patient's tolerance of                            previous anesthesia was also reviewed. The risks                            and benefits of the procedure and the sedation                            options and risks were discussed with the patient.                            All questions were answered, and informed consent                            was obtained. Prior Anticoagulants: The patient has                            taken no anticoagulant or antiplatelet agents. ASA                            Grade Assessment: II - A patient with mild systemic                            disease. After reviewing the risks and benefits,  the patient was deemed in satisfactory condition to                            undergo the procedure.                           After obtaining informed consent, the endoscope was                            passed under direct vision. Throughout the                            procedure, the patient's blood pressure, pulse, and                            oxygen  saturations were monitored  continuously. The                            GIF HQ190 #[id number] was introduced through the                            mouth, and advanced to the second part of duodenum.                            The upper GI endoscopy was accomplished without                            difficulty. The patient tolerated the procedure                            well. Scope In: Scope Out: Findings:                 Esophagogastric landmarks were identified: the                            Z-line was found at 36 cm, the gastroesophageal                            junction was found at 36 cm and the upper extent of                            the gastric folds was found at 40 cm from the                            incisors.                           A 4 cm hiatal hernia was present.                           LA Grade C esophagitis was found in the distal                            esophagus / GEJ.  One benign-appearing, intrinsic mild stenosis was                            found at the GEJ and was widely patent.                           The exam of the esophagus was otherwise normal.                           A single 4 mm sessile polyp was found in the                            gastric fundus with inflammatory changes and old                            heme. The polyp was removed with a cold biopsy                            forceps. Resection and retrieval were complete.                           The exam of the stomach was otherwise normal.                           The examined duodenum was normal. Complications:            No immediate complications. Estimated blood loss:                            Minimal. Estimated Blood Loss:     Estimated blood loss was minimal. Impression:               - Esophagogastric landmarks identified.                           - 4 cm hiatal hernia.                           - LA Grade C reflux esophagitis.                           -  Benign-appearing esophageal stenosis, widely                            patent currently.                           - Normal esophagus otherwise.                           - A single gastric polyp. Resected and retrieved.                           - Normal stomach otherwise.                           -  Normal examined duodenum.                           Significant esophagitis in the setting of hiatal                            hernia. If patient is not on antacids I am afraid                            this will progress / worsen. He has held PPI due to                            CKD - recommend he discuss resumption of PPI at                            some capacity (low dose?) with primary care                            physician or nephrologist (if he has one). Until                            then, would start peptid 20mg  twice daily. Recommendation:           - Patient has a contact number available for                            emergencies. The signs and symptoms of potential                            delayed complications were discussed with the                            patient. Return to normal activities tomorrow.                            Written discharge instructions were provided to the                            patient.                           - Resume previous diet.                           - Continue present medications.                           - Start pepcid  20mg  twice daily                           - Discuss resumption of PPI with PCP /                            nephrologist, if safe to due so from renal  perspective                           - Await pathology results. Elspeth P. Farheen Pfahler, MD 07/04/2024 2:19:26 PM This report has been signed electronically.

## 2024-07-04 NOTE — Progress Notes (Signed)
 [facility] History and Physical   Primary Care Physician:  Royden Ronal Czar, FNP   Reason for Procedure:   Rectal bleeding, anemia, GERD  Plan:    EGD and colonoscopy     HPI: John Bird is a 73 y.o. male  here for EGD and colonoscopy - he had had recurrent rectal bleeding. Last colonoscopy in 2018, here for colonoscopy to further evaluate. Also with worsening GERD despite omeprazole . Had regimen switched to protonix  40mg  / day. He stopped taking it due to CKD. EGD to further evaluate.     Patient denies any bowel symptoms at this time. No family history of colon cancer known. Otherwise feels well without any cardiopulmonary symptoms.   I have discussed risks / benefits of anesthesia and endoscopic procedure with Thais JINNY Salters and they wish to proceed with the exams as outlined today.    Past Medical History:  Diagnosis Date   Allergy    Anal fissure    Arthritis    Cancer (HCC)    basal cell CA- forehead   DDD (degenerative disc disease), cervical    Hemorrhoids    Hernia, inguinal    Hiatal hernia    Hiatal hernia    Hypertension    IBS (irritable bowel syndrome)    Prostate atrophy 2023   Ulcer     Past Surgical History:  Procedure Laterality Date   FINGER SURGERY     Left Pinky finger   INGUINAL HERNIA REPAIR     bilateral   MOHS SURGERY     top of his head   NASAL SEPTUM SURGERY     right knee surgery     TONSILLECTOMY AND ADENOIDECTOMY     TONSILLECTOMY AND ADENOIDECTOMY      Prior to Admission medications   Medication Sig Start Date End Date Taking? Authorizing Provider  hydrALAZINE  (APRESOLINE ) 50 MG tablet TAKE 1 TABLET BY MOUTH THREE TIMES A DAY 11/09/23  Yes Ladona Heinz, MD  hydroxychloroquine (PLAQUENIL) 200 MG tablet Take 200 mg by mouth 2 (two) times daily.   Yes [provider]  valsartan -hydrochlorothiazide  (DIOVAN -HCT) 320-12.5 MG tablet TAKE 1 TABLET BY MOUTH EVERY DAY IN THE MORNING 06/07/24  Yes Ladona Heinz, MD   cyanocobalamin  (VITAMIN B12) 500 MCG tablet 1 tablet Orally Once a day    [provider]  pantoprazole  (PROTONIX ) 40 MG tablet Take 1 tablet (40 mg total) by mouth daily. 05/08/24   [place], PA-C    Current Outpatient Medications  Medication Sig Dispense Refill   hydrALAZINE  (APRESOLINE ) 50 MG tablet TAKE 1 TABLET BY MOUTH THREE TIMES A DAY 270 tablet 3   hydroxychloroquine (PLAQUENIL) 200 MG tablet Take 200 mg by mouth 2 (two) times daily.     valsartan -hydrochlorothiazide  (DIOVAN -HCT) 320-12.5 MG tablet TAKE 1 TABLET BY MOUTH EVERY DAY IN THE MORNING 90 tablet 3   cyanocobalamin  (VITAMIN B12) 500 MCG tablet 1 tablet Orally Once a day     pantoprazole  (PROTONIX ) 40 MG tablet Take 1 tablet (40 mg total) by mouth daily. 90 tablet 3   Current Facility-Administered Medications  Medication Dose Route Frequency Provider Last Rate Last Admin   0.9 %  sodium chloride  infusion  500 mL Intravenous Once Paisli Silfies, Elspeth SQUIBB, MD        Allergies as of 07/04/2024   (No Known Allergies)    Family History  Problem Relation Age of Onset   Hypertension Mother    Heart failure Mother 27  Passed at 103   Heart disease Father    Alzheimer's disease Father 49   Heart disease Brother    Colon cancer Neg Hx    Esophageal cancer Neg Hx    Rectal cancer Neg Hx    Stomach cancer Neg Hx     Social History   Socioeconomic History   Marital status: Married    Spouse name: Not on file   Number of children: 2   Years of education: Not on file   Highest education level: Not on file  Occupational History   Occupation: retired  Tobacco Use   Smoking status: Former    Current packs/day: 0.00    Average packs/day: 1.5 packs/day for 15.0 years (22.5 ttl pk-yrs)    Types: Cigarettes    Start date: 10/11/1965    Quit date: 10/11/1980    Years since quitting: 43.7   Smokeless tobacco: Never  Vaping Use   Vaping status: Never Used  Substance and Sexual Activity   Alcohol use: Yes     Comment: 2-3 drinks per MONTH   Drug use: No   Sexual activity: Not on file  Other Topics Concern   Not on file  Social History Narrative   Not on file   Social Drivers of Health   Financial Resource Strain: Not on file  Food Insecurity: Not on file  Transportation Needs: Not on file  Physical Activity: Not on file  Stress: Not on file  Social Connections: Not on file  Intimate Partner Violence: Not on file    Review of Systems: All other review of systems negative except as mentioned in the HPI.  Physical Exam: Vital signs BP 131/79   Pulse (!) 57   Temp 98.1 F (36.7 C)   Ht 5' 7 (1.702 m)   Wt 184 lb (83.5 kg)   SpO2 98%   BMI 28.82 kg/m   General:   Alert,  Well-developed, pleasant and cooperative in NAD Lungs:  Clear throughout to auscultation.   Heart:  Regular rate and rhythm Abdomen:  Soft, nontender and nondistended.   Neuro/Psych:  Alert and cooperative. Normal mood and affect. A and O x 3  Marcey Naval, MD [facility]

## 2024-07-04 NOTE — Progress Notes (Signed)
 Pt's states no medical or surgical changes since previsit or office visit.

## 2024-07-04 NOTE — Progress Notes (Signed)
 Sedate, gd SR, tolerated procedure well, VSS, report to RN

## 2024-07-04 NOTE — Patient Instructions (Addendum)
 Please read handouts provided. Continue present medications. Await pathology results. Resume previous diet. Start Pepcid  20 mg twice daily. Daily fiber supplement - Citrucel or Benefiber. Start diltiazem/lidocaine ointment - pea sized amount per rectum three times a day for 4 weeks or until healed.  YOU HAD AN ENDOSCOPIC PROCEDURE TODAY AT THE [facility]:   Refer to the procedure report that was given to you for any specific questions about what was found during the examination.  If the procedure report does not answer your questions, please call your gastroenterologist to clarify.  If you requested that your care partner not be given the details of your procedure findings, then the procedure report has been included in a sealed envelope for you to review at your convenience later.  YOU SHOULD EXPECT: Some feelings of bloating in the abdomen. Passage of more gas than usual.  Walking can help get rid of the air that was put into your GI tract during the procedure and reduce the bloating. If you had a lower endoscopy (such as a colonoscopy or flexible sigmoidoscopy) you may notice spotting of blood in your stool or on the toilet paper. If you underwent a bowel prep for your procedure, you may not have a normal bowel movement for a few days.  Please Note:  You might notice some irritation and congestion in your nose or some drainage.  This is from the oxygen  used during your procedure.  There is no need for concern and it should clear up in a day or so.  SYMPTOMS TO REPORT IMMEDIATELY:  Following lower endoscopy (colonoscopy or flexible sigmoidoscopy):  Excessive amounts of blood in the stool  Significant tenderness or worsening of abdominal pains  Swelling of the abdomen that is new, acute  Fever of 100F or higher  Following upper endoscopy (EGD)  Vomiting of blood or coffee ground material  New chest pain or pain under the shoulder blades  Painful or persistently difficult  swallowing  New shortness of breath  Fever of 100F or higher  Black, tarry-looking stools  For urgent or emergent issues, a gastroenterologist can be reached at any hour by calling [phone]. Do not use MyChart messaging for urgent concerns.    DIET:  We do recommend a small meal at first, but then you may proceed to your regular diet.  Drink plenty of fluids but you should avoid alcoholic beverages for 24 hours.  ACTIVITY:  You should plan to take it easy for the rest of today and you should NOT DRIVE or use heavy machinery until tomorrow (because of the sedation medicines used during the test).    FOLLOW UP: Our staff will call the number listed on your records the next business day following your procedure.  We will call around 7:15- 8:00 am to check on you and address any questions or concerns that you may have regarding the information given to you following your procedure. If we do not reach you, we will leave a message.     If any biopsies were taken you will be contacted by phone or by letter within the next 1-3 weeks.  Please call us  at [phone] if you have not heard about the biopsies in 3 weeks.    SIGNATURES/CONFIDENTIALITY: You and/or your care partner have signed paperwork which will be entered into your electronic medical record.  These signatures attest to the fact that that the information above on your After Visit Summary has been reviewed and is understood.  Full responsibility of the confidentiality of this discharge information lies with you and/or your care-partner.

## 2024-07-04 NOTE — Progress Notes (Signed)
 Called to room to assist during endoscopic procedure.  Patient ID and intended procedure confirmed with present staff. Received instructions for my participation in the procedure from the performing physician.

## 2024-07-05 ENCOUNTER — Telehealth: Payer: Self-pay

## 2024-07-05 NOTE — Telephone Encounter (Signed)
 Attempted f/u call. No answer, phone busy.

## 2024-07-09 DIAGNOSIS — M79641 Pain in right hand: Secondary | ICD-10-CM | POA: Diagnosis not present

## 2024-07-09 DIAGNOSIS — M79642 Pain in left hand: Secondary | ICD-10-CM | POA: Diagnosis not present

## 2024-07-09 DIAGNOSIS — Z6828 Body mass index (BMI) 28.0-28.9, adult: Secondary | ICD-10-CM | POA: Diagnosis not present

## 2024-07-09 DIAGNOSIS — M0609 Rheumatoid arthritis without rheumatoid factor, multiple sites: Secondary | ICD-10-CM | POA: Diagnosis not present

## 2024-07-09 DIAGNOSIS — E663 Overweight: Secondary | ICD-10-CM | POA: Diagnosis not present

## 2024-07-09 DIAGNOSIS — M2560 Stiffness of unspecified joint, not elsewhere classified: Secondary | ICD-10-CM | POA: Diagnosis not present

## 2024-07-09 DIAGNOSIS — M2540 Effusion, unspecified joint: Secondary | ICD-10-CM | POA: Diagnosis not present

## 2024-07-09 LAB — SURGICAL PATHOLOGY

## 2024-07-10 ENCOUNTER — Ambulatory Visit: Payer: Self-pay | Admitting: Gastroenterology

## 2024-07-16 DIAGNOSIS — L570 Actinic keratosis: Secondary | ICD-10-CM | POA: Diagnosis not present

## 2024-07-25 DIAGNOSIS — R972 Elevated prostate specific antigen [PSA]: Secondary | ICD-10-CM | POA: Diagnosis not present

## 2024-07-25 DIAGNOSIS — M2540 Effusion, unspecified joint: Secondary | ICD-10-CM | POA: Diagnosis not present

## 2024-07-25 DIAGNOSIS — D649 Anemia, unspecified: Secondary | ICD-10-CM | POA: Diagnosis not present

## 2024-07-25 DIAGNOSIS — R7989 Other specified abnormal findings of blood chemistry: Secondary | ICD-10-CM | POA: Diagnosis not present

## 2024-07-25 DIAGNOSIS — R748 Abnormal levels of other serum enzymes: Secondary | ICD-10-CM | POA: Diagnosis not present

## 2024-07-26 LAB — LAB REPORT - SCANNED: EGFR: 46

## 2024-07-31 ENCOUNTER — Ambulatory Visit: Admitting: Physician Assistant

## 2024-08-23 DIAGNOSIS — R7989 Other specified abnormal findings of blood chemistry: Secondary | ICD-10-CM | POA: Diagnosis not present

## 2024-08-23 DIAGNOSIS — I10 Essential (primary) hypertension: Secondary | ICD-10-CM | POA: Diagnosis not present

## 2024-08-23 DIAGNOSIS — D649 Anemia, unspecified: Secondary | ICD-10-CM | POA: Diagnosis not present

## 2024-08-23 DIAGNOSIS — R351 Nocturia: Secondary | ICD-10-CM | POA: Diagnosis not present

## 2024-08-23 DIAGNOSIS — N400 Enlarged prostate without lower urinary tract symptoms: Secondary | ICD-10-CM | POA: Diagnosis not present

## 2024-08-23 DIAGNOSIS — R972 Elevated prostate specific antigen [PSA]: Secondary | ICD-10-CM | POA: Diagnosis not present

## 2024-08-30 DIAGNOSIS — N3281 Overactive bladder: Secondary | ICD-10-CM | POA: Diagnosis not present

## 2024-08-30 DIAGNOSIS — R972 Elevated prostate specific antigen [PSA]: Secondary | ICD-10-CM | POA: Diagnosis not present

## 2024-08-30 DIAGNOSIS — N401 Enlarged prostate with lower urinary tract symptoms: Secondary | ICD-10-CM | POA: Diagnosis not present

## 2024-09-19 DIAGNOSIS — L570 Actinic keratosis: Secondary | ICD-10-CM | POA: Diagnosis not present

## 2024-09-19 DIAGNOSIS — D485 Neoplasm of uncertain behavior of skin: Secondary | ICD-10-CM | POA: Diagnosis not present
